# Patient Record
Sex: Male | Born: 1985 | Race: White | Hispanic: No | Marital: Married | Smoking: Current every day smoker
Health system: Southern US, Community
[De-identification: ages and names within clinical notes are randomized; demographics above are authoritative.]

## PROBLEM LIST (undated history)

## (undated) DIAGNOSIS — C801 Malignant (primary) neoplasm, unspecified: Secondary | ICD-10-CM

## (undated) HISTORY — PX: OTHER SURGICAL HISTORY: SHX169

---

## 2008-01-15 ENCOUNTER — Inpatient Hospital Stay: Payer: Self-pay | Admitting: Psychiatry

## 2010-03-16 ENCOUNTER — Emergency Department: Payer: Self-pay | Admitting: Emergency Medicine

## 2010-09-28 ENCOUNTER — Emergency Department: Payer: Self-pay | Admitting: Emergency Medicine

## 2010-11-11 ENCOUNTER — Emergency Department: Payer: Self-pay | Admitting: Unknown Physician Specialty

## 2011-03-30 ENCOUNTER — Emergency Department: Payer: Self-pay | Admitting: Internal Medicine

## 2011-07-22 ENCOUNTER — Emergency Department: Payer: Self-pay | Admitting: Emergency Medicine

## 2013-09-15 ENCOUNTER — Emergency Department: Payer: Self-pay | Admitting: Emergency Medicine

## 2013-09-15 LAB — CBC WITH DIFFERENTIAL/PLATELET
BASOS ABS: 0.1 10*3/uL (ref 0.0–0.1)
Basophil %: 0.4 %
EOS ABS: 0.1 10*3/uL (ref 0.0–0.7)
EOS PCT: 0.7 %
HCT: 44.4 % (ref 40.0–52.0)
HGB: 14.6 g/dL (ref 13.0–18.0)
Lymphocyte #: 1.5 10*3/uL (ref 1.0–3.6)
Lymphocyte %: 8.1 %
MCH: 30.5 pg (ref 26.0–34.0)
MCHC: 32.9 g/dL (ref 32.0–36.0)
MCV: 93 fL (ref 80–100)
Monocyte #: 0.7 x10 3/mm (ref 0.2–1.0)
Monocyte %: 3.8 %
NEUTROS ABS: 15.7 10*3/uL — AB (ref 1.4–6.5)
Neutrophil %: 87 %
PLATELETS: 318 10*3/uL (ref 150–440)
RBC: 4.78 10*6/uL (ref 4.40–5.90)
RDW: 12.9 % (ref 11.5–14.5)
WBC: 18.1 10*3/uL — ABNORMAL HIGH (ref 3.8–10.6)

## 2013-09-15 LAB — LIPASE, BLOOD: Lipase: 56 U/L — ABNORMAL LOW (ref 73–393)

## 2013-09-15 LAB — COMPREHENSIVE METABOLIC PANEL
Albumin: 4.7 g/dL (ref 3.4–5.0)
Alkaline Phosphatase: 66 U/L
Anion Gap: 4 — ABNORMAL LOW (ref 7–16)
BUN: 15 mg/dL (ref 7–18)
Bilirubin,Total: 0.9 mg/dL (ref 0.2–1.0)
CALCIUM: 10 mg/dL (ref 8.5–10.1)
CHLORIDE: 105 mmol/L (ref 98–107)
CO2: 29 mmol/L (ref 21–32)
CREATININE: 1.04 mg/dL (ref 0.60–1.30)
EGFR (Non-African Amer.): >60
GLUCOSE: 109 mg/dL — AB (ref 65–99)
Osmolality: 277 (ref 275–301)
POTASSIUM: 3.7 mmol/L (ref 3.5–5.1)
SGOT(AST): 27 U/L (ref 15–37)
SGPT (ALT): 26 U/L
Sodium: 138 mmol/L (ref 136–145)
Total Protein: 8.8 g/dL — ABNORMAL HIGH (ref 6.4–8.2)

## 2013-09-15 LAB — URINALYSIS, COMPLETE
Bacteria: NONE SEEN
Bilirubin,UR: NEGATIVE
Blood: NEGATIVE
Glucose,UR: NEGATIVE mg/dL (ref 0–75)
Leukocyte Esterase: NEGATIVE
Nitrite: NEGATIVE
Ph: 5 (ref 4.5–8.0)
SPECIFIC GRAVITY: 1.032 (ref 1.003–1.030)
Squamous Epithelial: NONE SEEN
WBC UR: NONE SEEN /HPF (ref 0–5)

## 2013-09-15 LAB — TROPONIN I: Troponin-I: <0.02 ng/mL

## 2014-05-18 ENCOUNTER — Emergency Department
Admit: 2014-05-18 | Disposition: A | Discharge status: Home / Self Care | Payer: Self-pay | Admitting: Emergency Medicine

## 2014-07-10 ENCOUNTER — Emergency Department
Admission: EM | Admit: 2014-07-10 | Discharge: 2014-07-10 | Disposition: A | Discharge status: Home / Self Care | Payer: Self-pay | Attending: Emergency Medicine | Admitting: Emergency Medicine

## 2014-07-10 ENCOUNTER — Encounter: Payer: Self-pay | Admitting: Emergency Medicine

## 2014-07-10 DIAGNOSIS — Z720 Tobacco use: Secondary | ICD-10-CM | POA: Insufficient documentation

## 2014-07-10 DIAGNOSIS — K029 Dental caries, unspecified: Secondary | ICD-10-CM | POA: Insufficient documentation

## 2014-07-10 DIAGNOSIS — K0381 Cracked tooth: Secondary | ICD-10-CM | POA: Insufficient documentation

## 2014-07-10 MED ORDER — LIDOCAINE VISCOUS 2 % MT SOLN: 20.0000 mL | OROMUCOSAL | Status: DC | PRN

## 2014-07-10 MED ORDER — HYDROCODONE-ACETAMINOPHEN 5-325 MG PO TABS: 1.0000 | ORAL_TABLET | ORAL | Status: DC | PRN

## 2014-07-10 MED ORDER — IBUPROFEN 800 MG PO TABS: 800.0000 mg | ORAL_TABLET | Freq: Three times a day (TID) | ORAL | Status: DC | PRN

## 2014-07-10 MED ORDER — AMOXICILLIN 500 MG PO TABS: 500.0000 mg | ORAL_TABLET | Freq: Three times a day (TID) | ORAL | Status: DC

## 2014-07-10 NOTE — ED Notes (Signed)
NAD noted at time of D/C. Pt denies questions or concerns. Pt ambulatory to the lobby at this time.  

## 2014-07-10 NOTE — ED Notes (Signed)
Patient presents to the ED with dental pain x 1 week.  Patient states his tooth cracked yesterday.  Patient is in no obvious distress at this time.
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
## 2014-07-10 NOTE — Discharge Instructions (Signed)
Dental Pain A tooth ache may be caused by cavities (tooth decay). Cavities expose the nerve of the tooth to air and hot or cold temperatures. It may come from an infection or abscess (also called a boil or furuncle) around your tooth. It is also often caused by dental caries (tooth decay). This causes the pain you are having. DIAGNOSIS  Your caregiver can diagnose this problem by exam. TREATMENT   If caused by an infection, it may be treated with medications which kill germs (antibiotics) and pain medications as prescribed by your caregiver. Take medications as directed.  Only take over-the-counter or prescription medicines for pain, discomfort, or fever as directed by your caregiver.  Whether the tooth ache today is caused by infection or dental disease, you should see your dentist as soon as possible for further care. SEEK MEDICAL CARE IF: The exam and treatment you received today has been provided on an emergency basis only. This is not a substitute for complete medical or dental care. If your problem worsens or new problems (symptoms) appear, and you are unable to meet with your dentist, call or return to this location. SEEK IMMEDIATE MEDICAL CARE IF:   You have a fever.  You develop redness and swelling of your face, jaw, or neck.  You are unable to open your mouth.  You have severe pain uncontrolled by pain medicine. MAKE SURE YOU:   Understand these instructions.  Will watch your condition.  Will get help right away if you are not doing well or get worse. Document Released: 02/04/2005 Document Revised: 04/29/2011 Document Reviewed: 09/23/2007 [facility] Patient Information 2015 [place], [place]. This information is not intended to replace advice given to you by your health care provider. Make sure you discuss any questions you have with your health care provider.  Dental Caries Dental caries (also called tooth decay) is the most common oral disease. It can occur at any age but is  more common in children and young adults.  HOW DENTAL CARIES DEVELOPS  The process of decay begins when bacteria and foods (particularly sugars and starches) combine in your mouth to produce plaque. Plaque is a substance that sticks to the hard, outer surface of a tooth (enamel). The bacteria in plaque produce acids that attack enamel. These acids may also attack the root surface of a tooth (cementum) if it is exposed. Repeated attacks dissolve these surfaces and create holes in the tooth (cavities). If left untreated, the acids destroy the other layers of the tooth.  RISK FACTORS  Frequent sipping of sugary beverages.   Frequent snacking on sugary and starchy foods, especially those that easily get stuck in the teeth.   Poor oral hygiene.   Dry mouth.   Substance abuse such as methamphetamine abuse.   Broken or poor-fitting dental restorations.   Eating disorders.   Gastroesophageal reflux disease (GERD).   Certain radiation treatments to the head and neck. SYMPTOMS In the early stages of dental caries, symptoms are seldom present. Sometimes white, chalky areas may be seen on the enamel or other tooth layers. In later stages, symptoms may include:  Pits and holes on the enamel.  Toothache after sweet, hot, or cold foods or drinks are consumed.  Pain around the tooth.  Swelling around the tooth. DIAGNOSIS  Most of the time, dental caries is detected during a regular dental checkup. A diagnosis is made after a thorough medical and dental history is taken and the surfaces of your teeth are checked for signs of  dental caries. Sometimes special instruments, such as lasers, are used to check for dental caries. Dental X-ray exams may be taken so that areas not visible to the eye (such as between the contact areas of the teeth) can be checked for cavities.  TREATMENT  If dental caries is in its early stages, it may be reversed with a fluoride treatment or an application of a  remineralizing agent at the dental office. Thorough brushing and flossing at home is needed to aid these treatments. If it is in its later stages, treatment depends on the location and extent of tooth destruction:   If a small area of the tooth has been destroyed, the destroyed area will be removed and cavities will be filled with a material such as gold, silver amalgam, or composite resin.   If a large area of the tooth has been destroyed, the destroyed area will be removed and a cap (crown) will be fitted over the remaining tooth structure.   If the center part of the tooth (pulp) is affected, a procedure called a root canal will be needed before a filling or crown can be placed.   If most of the tooth has been destroyed, the tooth may need to be pulled (extracted). HOME CARE INSTRUCTIONS You can prevent, stop, or reverse dental caries at home by practicing good oral hygiene. Good oral hygiene includes:  Thoroughly cleaning your teeth at least twice a day with a toothbrush and dental floss.   Using a fluoride toothpaste. A fluoride mouth rinse may also be used if recommended by your dentist or health care provider.   Restricting the amount of sugary and starchy foods and sugary liquids you consume.   Avoiding frequent snacking on these foods and sipping of these liquids.   Keeping regular visits with a dentist for checkups and cleanings. PREVENTION   Practice good oral hygiene.  Consider a dental sealant. A dental sealant is a coating material that is applied by your dentist to the pits and grooves of teeth. The sealant prevents food from being trapped in them. It may protect the teeth for several years.  Ask about fluoride supplements if you live in a community without fluorinated water or with water that has a low fluoride content. Use fluoride supplements as directed by your dentist or health care provider.  Allow fluoride varnish applications to teeth if directed by your  dentist or health care provider. Document Released: 10/27/2001 Document Revised: 06/21/2013 Document Reviewed: 02/07/2012 [facility] Patient Information 2015 [place], [place]. This information is not intended to replace advice given to you by your health care provider. Make sure you discuss any questions you have with your health care provider.
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
## 2014-07-31 ENCOUNTER — Emergency Department: Payer: Self-pay

## 2014-07-31 ENCOUNTER — Encounter: Payer: Self-pay | Admitting: Emergency Medicine

## 2014-07-31 ENCOUNTER — Other Ambulatory Visit: Payer: Self-pay

## 2014-07-31 DIAGNOSIS — Z792 Long term (current) use of antibiotics: Secondary | ICD-10-CM | POA: Insufficient documentation

## 2014-07-31 DIAGNOSIS — Y999 Unspecified external cause status: Secondary | ICD-10-CM | POA: Insufficient documentation

## 2014-07-31 DIAGNOSIS — T672XXA Heat cramp, initial encounter: Secondary | ICD-10-CM | POA: Insufficient documentation

## 2014-07-31 DIAGNOSIS — Y9389 Activity, other specified: Secondary | ICD-10-CM | POA: Insufficient documentation

## 2014-07-31 DIAGNOSIS — E876 Hypokalemia: Secondary | ICD-10-CM | POA: Insufficient documentation

## 2014-07-31 DIAGNOSIS — Y9289 Other specified places as the place of occurrence of the external cause: Secondary | ICD-10-CM | POA: Insufficient documentation

## 2014-07-31 DIAGNOSIS — X32XXXA Exposure to sunlight, initial encounter: Secondary | ICD-10-CM | POA: Insufficient documentation

## 2014-07-31 DIAGNOSIS — Z720 Tobacco use: Secondary | ICD-10-CM | POA: Insufficient documentation

## 2014-07-31 LAB — CBC WITH DIFFERENTIAL/PLATELET
BASOS ABS: 0.1 10*3/uL (ref 0–0.1)
Basophils Relative: 1 %
Eosinophils Absolute: 0.4 10*3/uL (ref 0–0.7)
Eosinophils Relative: 4 %
HEMATOCRIT: 44.1 % (ref 40.0–52.0)
Hemoglobin: 15.5 g/dL (ref 13.0–18.0)
Lymphocytes Relative: 25 %
Lymphs Abs: 2.8 10*3/uL (ref 1.0–3.6)
MCH: 31.1 pg (ref 26.0–34.0)
MCHC: 35.1 g/dL (ref 32.0–36.0)
MCV: 88.5 fL (ref 80.0–100.0)
MONO ABS: 1.1 10*3/uL — AB (ref 0.2–1.0)
Monocytes Relative: 10 %
NEUTROS ABS: 6.9 10*3/uL — AB (ref 1.4–6.5)
Neutrophils Relative %: 60 %
Platelets: 309 10*3/uL (ref 150–440)
RBC: 4.98 MIL/uL (ref 4.40–5.90)
RDW: 12.8 % (ref 11.5–14.5)
WBC: 11.3 10*3/uL — ABNORMAL HIGH (ref 3.8–10.6)

## 2014-07-31 LAB — COMPREHENSIVE METABOLIC PANEL
ALT: 44 U/L (ref 17–63)
ANION GAP: 11 (ref 5–15)
AST: 67 U/L — ABNORMAL HIGH (ref 15–41)
Albumin: 5.1 g/dL — ABNORMAL HIGH (ref 3.5–5.0)
Alkaline Phosphatase: 75 U/L (ref 38–126)
BUN: 21 mg/dL — AB (ref 6–20)
CHLORIDE: 101 mmol/L (ref 101–111)
CO2: 25 mmol/L (ref 22–32)
Calcium: 9.7 mg/dL (ref 8.9–10.3)
Creatinine, Ser: 1.1 mg/dL (ref 0.61–1.24)
GFR calc Af Amer: >60 mL/min (ref >60)
GLUCOSE: 86 mg/dL (ref 65–99)
Potassium: 3.3 mmol/L — ABNORMAL LOW (ref 3.5–5.1)
SODIUM: 137 mmol/L (ref 135–145)
TOTAL PROTEIN: 8.4 g/dL — AB (ref 6.5–8.1)
Total Bilirubin: 0.8 mg/dL (ref 0.3–1.2)

## 2014-07-31 MED ORDER — SODIUM CHLORIDE 0.9 % IV BOLUS (SEPSIS)
1000.0000 mL | Freq: Once | INTRAVENOUS | Status: AC
  Administered 2014-07-31: 1000 mL via INTRAVENOUS

## 2014-08-01 ENCOUNTER — Emergency Department
Admission: EM | Admit: 2014-08-01 | Discharge: 2014-08-01 | Disposition: A | Discharge status: Home / Self Care | Payer: Self-pay | Attending: Emergency Medicine | Admitting: Emergency Medicine

## 2014-08-01 DIAGNOSIS — M791 Myalgia, unspecified site: Secondary | ICD-10-CM

## 2014-08-01 DIAGNOSIS — T672XXA Heat cramp, initial encounter: Secondary | ICD-10-CM

## 2014-08-01 DIAGNOSIS — E876 Hypokalemia: Secondary | ICD-10-CM

## 2014-08-01 DIAGNOSIS — L559 Sunburn, unspecified: Secondary | ICD-10-CM

## 2014-08-01 LAB — TROPONIN I

## 2014-08-01 LAB — CK: CK TOTAL: 186 U/L (ref 49–397)

## 2014-08-01 MED ORDER — SODIUM CHLORIDE 0.9 % IV SOLN
Freq: Once | INTRAVENOUS | Status: AC
  Administered 2014-08-01: 01:00:00 via INTRAVENOUS

## 2014-08-01 MED ORDER — KETOROLAC TROMETHAMINE 30 MG/ML IJ SOLN
30.0000 mg | Freq: Once | INTRAMUSCULAR | Status: AC
  Administered 2014-08-01: 30 mg via INTRAVENOUS

## 2014-08-01 MED ORDER — POTASSIUM CHLORIDE CRYS ER 20 MEQ PO TBCR
EXTENDED_RELEASE_TABLET | ORAL | Status: AC
  Filled 2014-08-01: qty 2

## 2014-08-01 MED ORDER — KETOROLAC TROMETHAMINE 30 MG/ML IJ SOLN
INTRAMUSCULAR | Status: AC
  Filled 2014-08-01: qty 1

## 2014-08-01 MED ORDER — POTASSIUM CHLORIDE CRYS ER 20 MEQ PO TBCR
40.0000 meq | EXTENDED_RELEASE_TABLET | Freq: Once | ORAL | Status: AC
  Administered 2014-08-01: 40 meq via ORAL

## 2014-08-01 MED ORDER — SODIUM CHLORIDE 0.9 % IV BOLUS (SEPSIS)
1000.0000 mL | Freq: Once | INTRAVENOUS | Status: AC
  Administered 2014-08-01: 1000 mL via INTRAVENOUS
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
## 2014-08-01 NOTE — ED Notes (Signed)
Called lab to add CK on blood work.

## 2014-08-01 NOTE — ED Provider Notes (Signed)
[facility] Emergency Department Provider Note  ____________________________________________  Time seen: Approximately 1:15 AM  I have reviewed the triage vital signs and the nursing notes.   HISTORY  Chief Complaint Generalized Body Aches    HPI Trevor Zimmerman is a 29 y.o. male who presents with wife for myalgias. Per spouse, patient has worked 16 hour days all weekend and "made more money this week than he does in one month". 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 Patient returned home this evening feeling generally weak with myalgias, cramping in his lower extremities. Patient's spouse says he was "in and out" and seemed lethargic. Patient denies fall, trauma, injury. Patient does complain of a nonproductive cough in addition to extreme fatigue. Denies fever, chills, chest pain, shortness of breath, abdominal pain, nausea, vomiting, diarrhea, numbness, tingling.   History reviewed. No pertinent past medical history.  There are no active problems to display for this patient.   Past Surgical History  Procedure Laterality Date  . 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 amoxicillin (AMOXIL) 500 MG tablet   Oral   Take 1 tablet (500 mg total) by mouth 3 (three) times daily.   30 tablet   0   . HYDROcodone-acetaminophen (NORCO) 5-325 MG per tablet   Oral   Take 1 tablet by mouth every 4 (four) hours as needed for moderate pain.   12 tablet   0   . ibuprofen (ADVIL,MOTRIN) 800 MG tablet   Oral   Take 1 tablet (800 mg total) by mouth every 8 (eight) hours as needed.   30 tablet   0   . lidocaine (XYLOCAINE) 2 % solution   Mouth/Throat   Use as directed 20 mLs in the mouth or throat as needed for mouth pain.   100 mL   0     Allergies Review of patient's allergies indicates no known allergies.  History reviewed. No  pertinent family history.  Social History History  Substance Use Topics  . Smoking status: Current Every Day Smoker -- 0.50 packs/day    Types: Cigarettes  . Smokeless tobacco: Not on file  . Alcohol Use: No    Review of Systems Constitutional: Positive for malaise and generalized weakness. No fever/chills Eyes: No visual changes. ENT: No sore throat. Cardiovascular: Denies chest pain. Respiratory: Denies shortness of breath. Gastrointestinal: No abdominal pain.  No nausea, no vomiting.  No diarrhea.  No constipation. Genitourinary: Negative for dysuria. Musculoskeletal: Negative for back pain. Skin: Positive for sunburn. Negative for rash. Neurological: Negative for headaches, focal weakness or numbness.  10-point ROS otherwise negative.  ____________________________________________   PHYSICAL EXAM:  VITAL SIGNS: ED Triage Vitals  Enc Vitals Group     BP 07/31/14 2255 142/91 mmHg     Pulse Rate 07/31/14 2255 99     Resp 08/01/14 0041 16     Temp 07/31/14 2255 98.3 F (36.8 C)     Temp Source 07/31/14 2255 Oral     SpO2 07/31/14 2255 99 %     Weight 07/31/14 2255 125 lb (56.7 kg)     Height 07/31/14 2255 5\' 6"  (1.676 m)     Head Cir --      Peak Flow --      Pain Score 07/31/14 2256 4     Pain  Loc --      Pain Edu? --      Excl. in [place]? --     Constitutional: Sleeping. Appears fatigued when awakened for exam. Eyes: Conjunctivae are normal. PERRL. EOMI. Head: Atraumatic. Nose: No congestion/rhinnorhea. Mouth/Throat: Mucous membranes are dry.  Oropharynx non-erythematous. Neck: No stridor.   Cardiovascular: Normal rate, regular rhythm. Grossly normal heart sounds.  Good peripheral circulation. Respiratory: Normal respiratory effort.  No retractions. Lungs CTAB. Gastrointestinal: Soft and nontender. No distention. No abdominal bruits. No CVA tenderness. Musculoskeletal: No lower extremity tenderness nor edema.  No joint effusions. Neurologic:  Normal speech and  language. No gross focal neurologic deficits are appreciated. Speech is normal.  Skin:  First degree sunburn to face and extremities without blistering or peeling. Skin is warm, dry and intact. No rash noted. Psychiatric: Mood and affect are flat. Speech and behavior are normal.  ____________________________________________   LABS (all labs ordered are listed, but only abnormal results are displayed)  Labs Reviewed  COMPREHENSIVE METABOLIC PANEL - Abnormal; Notable for the following:    Potassium 3.3 (*)    BUN 21 (*)    Total Protein 8.4 (*)    Albumin 5.1 (*)    AST 67 (*)    All other components within normal limits  CBC WITH DIFFERENTIAL/PLATELET - Abnormal; Notable for the following:    WBC 11.3 (*)    Neutro Abs 6.9 (*)    Monocytes Absolute 1.1 (*)    All other components within normal limits  CK  TROPONIN I   ____________________________________________  EKG  ED ECG REPORT I, SUNG,JADE J, the attending physician, personally viewed and interpreted this ECG.   Date: 08/01/2014  EKG Time: 2336  Rate: 74  Rhythm: normal EKG, normal sinus rhythm  Axis: Normal  Intervals:none  ST&T Change: Nonspecific  ____________________________________________  RADIOLOGY  Chest 2 view (viewed by me, interpreted by Dr. Radene Knee): No acute cardiopulmonary process seen. ____________________________________________   PROCEDURES  Procedure(s) performed: None  Critical Care performed: No  ____________________________________________   INITIAL IMPRESSION / ASSESSMENT AND PLAN / ED COURSE  Pertinent labs & imaging results that were available during my care of the patient were reviewed by me and considered in my medical decision making (see chart for details).  29 year old male with prolonged sun exposure who presents with generalized weakness, malaise, leg cramping. Plan for IV fluid resuscitation, labs notable for hypokalemia; will replete while in the ED. Will check a CK and  reassess.  ----------------------------------------- 3:09 AM on 08/01/2014 -----------------------------------------  Patient resting in no acute distress. CK and troponin negative. Discussed with patient and spouse and given strict return precautions. Both verbalize understanding and agree with plan of care. ____________________________________________   FINAL CLINICAL IMPRESSION(S) / ED DIAGNOSES  Final diagnoses:  Heat cramps, initial encounter  Myalgia  Hypokalemia  Sunburn      Paulette Blanch, MD 08/01/14 [phone]

## 2014-08-01 NOTE — ED Notes (Signed)
Called lab to add troponin to blood work 

## 2014-08-01 NOTE — Discharge Instructions (Signed)
1. Stay indoors out of the heat and drink plenty of fluids over the next several days. 2. Eat foods which are rich in potassium (orange juice, bananas). 3. Return to the ER for worsening symptoms, persistent vomiting, difficulty breathing or other concerns.  Heat-Related Illness Heat-related illnesses occur when the body is unable to properly cool itself. The body normally cools itself by sweating. However, under some conditions sweating is not enough. In these cases, a person's body temperature rises rapidly. Very high body temperatures may damage the brain or other vital organs. Some examples of heat-related illnesses include:  Heat stroke. This occurs when the body is unable to regulate its temperature. The body's temperature rises rapidly, the sweating mechanism fails, and the body is unable to cool down. Body temperature may rise to 106 F (41 C) or higher within 10 to 15 minutes. Heat stroke can cause death or permanent disability if emergency treatment is not provided.  Heat exhaustion. This is a milder form of heat-related illness that can develop after several days of exposure to high temperatures and not enough fluids. It is the body's response to an excessive loss of the water and salt contained in sweat.  Heat cramps. These usually affect people who sweat a lot during heavy activity. This sweating drains the body's salt and moisture. The low salt level in the muscles causes painful cramps. Heat cramps may also be a symptom of heat exhaustion. Heat cramps usually occur in the abdomen, arms, or legs. Get medical attention for cramps if you have heart problems or are on a low-sodium diet. Those that are at greatest risk for heat-related illnesses include:   The elderly.  Infant and the very young.  People with mental illness and chronic diseases.  People who are overweight (obese).  Young and healthy people can even succumb to heat if they participate in strenuous physical activities  during hot weather. CAUSES  Several factors affect the body's ability to cool itself during extremely hot weather. When the humidity is high, sweat will not evaporate as quickly. This prevents the body from releasing heat quickly. Other factors that can affect the body's ability to cool down include:   Age.  Obesity.  Fever.  Dehydration.  Heart disease.  Mental illness.  Poor circulation.  Sunburn.  Prescription drug use.  Alcohol use. SYMPTOMS  Heat stroke: Warning signs of heat stroke vary, but may include:  An extremely high body temperature (above 103F orally).  A fast, strong pulse.  Dizziness.  Confusion.  Red, hot, and dry skin.  No sweating.  Throbbing headache.  Feeling sick to your stomach (nauseous).  Unconsciousness. Heat exhaustion: Warning signs of heat exhaustion include:  Heavy sweating.  Tiredness.  Headache.  Paleness.  Weakness.  Feeling sick to your stomach (nauseous) or vomiting.  Muscle cramps. Heat cramps  Muscle pains or spasms. TREATMENT  Heat stroke  Get into a cool environment. An indoor place that is air-conditioned may be best.  Take a cool shower or bath. Have someone around to make sure you are okay.  Take your temperature. Make sure it is going down. Heat exhaustion  Drink plenty of fluids. Do not drink liquids that contain caffeine, alcohol, or large amounts of sugar. These cause you to lose more body fluid. Also, avoid very cold drinks. They can cause stomach cramps.  Get into a cool environment. An indoor place that is air-conditioned may be best.  Take a cool shower or bath. Have someone around to make  sure you are okay.  Put on lightweight clothing. Heat cramps  Stop whatever activity you were doing. Do not attempt to do that activity for at least 3 hours after the cramps have gone away.  Get into a cool environment. An indoor place that is air-conditioned may be best. [place]  To  protect your health when temperatures are extremely high, follow these tips:  During heavy exercise in a hot environment, drink two to four glasses (16-32 ounces) of cool fluids each hour. Do not wait until you are thirsty to drink. Warning: If your caregiver limits the amount of fluid you drink or has you on water pills, ask how much you should drink while the weather is hot.  Do not drink liquids that contain caffeine, alcohol, or large amounts of sugar. These cause you to lose more body fluid.  Avoid very cold drinks. They can cause stomach cramps.  Wear appropriate clothing. Choose lightweight, light-colored, loose-fitting clothing.  If you must be outdoors, try to limit your outdoor activity to morning and evening hours. Try to rest often in shady areas.  If you are not used to working or exercising in a hot environment, start slowly and pick up the pace gradually.  Stay cool in an air-conditioned place if possible. If your home does not have air conditioning, go to the shopping mall or [employer].  Taking a cool shower or bath may help you cool off. SEEK MEDICAL CARE IF:   You see any of the symptoms listed above. You may be dealing with a life-threatening emergency.  Symptoms worsen or last longer than 1 hour.  Heat cramps do not get better in 1 hour. MAKE SURE YOU:   Understand these instructions.  Will watch your condition.  Will get help right away if you are not doing well or get worse. Document Released: 11/14/2007 Document Revised: 04/29/2011 Document Reviewed: 11/14/2007 [facility] Patient Information 2015 [place], [place]. This information is not intended to replace advice given to you by your health care provider. Make sure you discuss any questions you have with your health care provider.  Leg Cramps Leg cramps that occur during exercise can be caused by poor circulation or dehydration. However, muscle cramps that occur at rest or during the night are usually not  due to any serious medical problem. Heat cramps may cause muscle spasms during hot weather.  CAUSES There is no clear cause for muscle cramps. However, dehydration may be a factor for those who do not drink enough fluids and those who exercise in the heat. Imbalances in the level of sodium, potassium, calcium or magnesium in the muscle tissue may also be a factor. Some medications, such as water pills (diuretics), may cause loss of chemicals that the body needs (like sodium and potassium) and cause muscle cramps. TREATMENT   Make sure your diet has enough fluids and essential minerals for the muscle to work normally.  Avoid strenuous exercise for several days if you have been having frequent leg cramps.  Stretch and massage the cramped muscle for several minutes.  Some medicines may be helpful in some patients with night cramps. Only take over-the-counter or prescription medicines as directed by your caregiver. SEEK IMMEDIATE MEDICAL CARE IF:   Your leg cramps become worse.  Your foot becomes cold, numb, or blue. Document Released: 03/14/2004 Document Revised: 04/29/2011 Document Reviewed: 03/01/2008 [facility] Patient Information 2015 [place], [place]. This information is not intended to replace advice given to you by your health care  provider. Make sure you discuss any questions you have with your health care provider.  Sunburn Sunburn is damage to the skin caused by overexposure to ultraviolet (UV) rays. People with light skin or a fair complexion may be more susceptible to sunburn. Repeated sun exposure causes early skin aging such as wrinkles and sun spots. It also increases the risk of skin cancer. CAUSES A sunburn is caused by getting too much UV radiation from the sun. SYMPTOMS  Red or pink skin.  Soreness and swelling.  Pain.  Blisters.  Peeling skin.  Headache, fever, and fatigue if sunburn covers a large area. TREATMENT  Your caregiver may tell you to take certain  medicines to lessen inflammation.  Your caregiver may have you use hydrocortisone cream or spray to help with itching and inflammation.  Your caregiver may prescribe an antibiotic cream to use on blisters. HOME CARE INSTRUCTIONS   Avoid further exposure to the sun.  Cool baths and cool compresses may be helpful if used several times per day. Do not apply ice, since this may result in more damage to the skin.  Only take over-the-counter or prescription medicines for pain, discomfort, or fever as directed by your caregiver.  Use aloe or other over-the-counter sunburn creams or gels on your skin. Do not apply these creams or gels on blisters.  Drink enough fluids to keep your urine clear or pale yellow.  Do not break blisters. If blisters break, your caregiver may recommend an antibiotic cream to apply to the affected area. PREVENTION   Try to avoid the sun between 10:00 a.m. and 4:00 p.m. when it is the strongest.  Apply sunscreen at least 30 minutes before exposure to the sun.  Always wear protective hats, clothing, and sunglasses with UV protection.  Avoid medicines, herbs, and foods that increase your sensitivity to sunlight.  Avoid tanning beds. SEEK IMMEDIATE MEDICAL CARE IF:   You have a fever.  Your pain is uncontrolled with medicine.  You start to vomit or have diarrhea.  You feel faint or develop a headache with confusion.  You develop severe blistering.  You have a pus-like (purulent) discharge coming from the blisters.  Your burn becomes more painful and swollen. MAKE SURE YOU:  Understand these instructions.  Will watch your condition.  Will get help right away if you are not doing well or get worse. Document Released: 11/14/2004 Document Revised: 06/01/2012 Document Reviewed: 07/29/2010 [facility] Patient Information 2015 [place], [place]. This information is not intended to replace advice given to you by your health care provider. Make sure you discuss  any questions you have with your health care provider.

## 2015-04-06 ENCOUNTER — Encounter: Payer: Self-pay | Admitting: Medical Oncology

## 2015-04-06 ENCOUNTER — Emergency Department
Admission: EM | Admit: 2015-04-06 | Discharge: 2015-04-06 | Disposition: A | Discharge status: Home / Self Care | Payer: Medicaid Other | Attending: Emergency Medicine | Admitting: Emergency Medicine

## 2015-04-06 DIAGNOSIS — J069 Acute upper respiratory infection, unspecified: Secondary | ICD-10-CM | POA: Insufficient documentation

## 2015-04-06 DIAGNOSIS — F17210 Nicotine dependence, cigarettes, uncomplicated: Secondary | ICD-10-CM | POA: Diagnosis not present

## 2015-04-06 DIAGNOSIS — J029 Acute pharyngitis, unspecified: Secondary | ICD-10-CM

## 2015-04-06 DIAGNOSIS — R52 Pain, unspecified: Secondary | ICD-10-CM | POA: Diagnosis present

## 2015-04-06 LAB — RAPID INFLUENZA A&B ANTIGENS
Influenza A (ARMC): NOT DETECTED
Influenza B (ARMC): NOT DETECTED

## 2015-04-06 LAB — POCT RAPID STREP A: Streptococcus, Group A Screen (Direct): NEGATIVE

## 2015-04-06 MED ORDER — ALBUTEROL SULFATE HFA 108 (90 BASE) MCG/ACT IN AERS: 2.0000 | INHALATION_SPRAY | Freq: Four times a day (QID) | RESPIRATORY_TRACT | Status: DC | PRN

## 2015-04-06 MED ORDER — BENZONATATE 100 MG PO CAPS: 200.0000 mg | ORAL_CAPSULE | Freq: Three times a day (TID) | ORAL | Status: DC | PRN

## 2015-04-06 MED ORDER — IPRATROPIUM-ALBUTEROL 0.5-2.5 (3) MG/3ML IN SOLN
3.0000 mL | Freq: Once | RESPIRATORY_TRACT | Status: AC
  Administered 2015-04-06: 3 mL via RESPIRATORY_TRACT
  Filled 2015-04-06: qty 3

## 2015-04-06 NOTE — ED Provider Notes (Signed)
[facility] Emergency Department Provider Note ____________________________________________  Time seen: Approximately 11:52 AM  I have reviewed the triage vital signs and the nursing notes.   HISTORY  Chief Complaint Generalized Body Aches and Fatigue   HPI DEVINN LOSCALZO is a 30 y.o. male reports that he had sudden onset of flulike symptoms starting yesterday. Patient is unaware whether or not he has had a fever as he has not taken his temperature but he has had chills all day. There is been no vomiting or diarrhea. He has not taken any over-the-counter medication for this. He is currently a smoker and is aware that he has a very productive cough. He also complains of a "very sore throat". Currently he rates his pain is 5/10.Patient is a half pack a day smoker.   History reviewed. No pertinent past medical history.  There are no active problems to display for this patient.   Past Surgical History  Procedure Laterality Date  . 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 albuterol (PROVENTIL HFA;VENTOLIN HFA) 108 (90 Base) MCG/ACT inhaler   Inhalation   Inhale 2 puffs into the lungs every 6 (six) hours as needed for wheezing or shortness of breath.   1 Inhaler   2   . benzonatate (TESSALON PERLES) 100 MG capsule   Oral   Take 2 capsules (200 mg total) by mouth 3 (three) times daily as needed for cough.   30 capsule   0     Allergies Review of patient's allergies indicates no known allergies.  No family history on file.  Social History Social History  Substance Use Topics  . Smoking status: Current Every Day Smoker -- 0.50 packs/day    Types: Cigarettes  . Smokeless tobacco: None  . Alcohol Use: No    Review of Systems Constitutional: Possible fever/moderate chills Eyes: No visual changes. ENT: Positive sore throat. Cardiovascular: Denies chest pain. Respiratory: Denies  shortness of breath. Gastrointestinal: No abdominal pain.  No nausea, no vomiting.  No diarrhea.  No constipation. Genitourinary: Negative for dysuria. Musculoskeletal: Negative for back pain. Positive for body aches Skin: Negative for rash. Neurological: Negative for headaches, focal weakness or numbness.  10-point ROS otherwise negative.  ____________________________________________   PHYSICAL EXAM:  VITAL SIGNS: ED Triage Vitals  Enc Vitals Group     BP 04/06/15 1054 128/82 mmHg     Pulse Rate 04/06/15 1054 93     Resp 04/06/15 1054 20     Temp 04/06/15 1054 98.5 F (36.9 C)     Temp Source 04/06/15 1054 Oral     SpO2 04/06/15 1054 99 %     Weight 04/06/15 1054 125 lb (56.7 kg)     Height 04/06/15 1054 5\' 6"  (1.676 m)     Head Cir --      Peak Flow --      Pain Score 04/06/15 1055 5     Pain Loc --      Pain Edu? --      Excl. in [place]? --     Constitutional: Alert and oriented. Well appearing and in no acute distress. Eyes: Conjunctivae are normal. PERRL. EOMI. Head: Atraumatic. Nose: Mild congestion/rhinnorhea. EACs and TMs clear bilaterally. Mouth/Throat: Mucous membranes are moist.  Oropharynx erythematous no exudate was noted. Neck: No stridor.  Supple Hematological/Lymphatic/Immunilogical: No cervical lymphadenopathy. Cardiovascular: Normal rate, regular rhythm. Grossly normal heart sounds.  Good peripheral circulation. Respiratory: Normal respiratory effort.  No retractions. Lungs mild bilateral expiratory wheezes are heard throughout with very congested cough with wheezing not cleared with coughing. Gastrointestinal: Soft and nontender. No distention.  Musculoskeletal: No lower extremity tenderness nor edema.  No joint effusions. Neurologic:  Normal speech and language. No gross focal neurologic deficits are appreciated. No gait instability. Skin:  Skin is warm, dry and intact. No rash noted. Psychiatric: Mood and affect are normal. Speech and behavior are  normal.  ____________________________________________   LABS (all labs ordered are listed, but only abnormal results are displayed)  Labs Reviewed  RAPID INFLUENZA A&B ANTIGENS ([facility] ONLY)  CULTURE, GROUP A STREP [facility])  POCT RAPID STREP A    PROCEDURES  Procedure(s) performed: None  Critical Care performed: No  ____________________________________________   INITIAL IMPRESSION / ASSESSMENT AND PLAN / ED COURSE  Pertinent labs & imaging results that were available during my care of the patient were reviewed by me and considered in my medical decision making (see chart for details).  Influenza and strep tests were negative. Patient is encouraged take Tylenol or ibuprofen as needed for body aches. His also encouraged to discontinue smoking. He was given a prescription for Tessalon Perles as needed for cough and albuterol inhaler for wheezing. ____________________________________________   FINAL CLINICAL IMPRESSION(S) / ED DIAGNOSES  Final diagnoses:  Viral upper respiratory illness  Pharyngitis  Cigarette smoker      Johnn Hai, PA-C 04/06/15 1441  Earleen Newport, MD 04/06/15 1520

## 2015-04-06 NOTE — ED Notes (Signed)
Pt reports since yesterday he has been having flu like sx's.

## 2015-04-06 NOTE — Discharge Instructions (Signed)
Follow-up with [facility] clinic if any continued problems. Increase fluids. Tylenol or ibuprofen as needed for fever or body aches. Albuterol inhaler as needed for wheezing and Tessalon Perles as needed for cough.

## 2015-04-08 LAB — CULTURE, GROUP A STREP (THRC)

## 2015-06-11 ENCOUNTER — Emergency Department
Admission: EM | Admit: 2015-06-11 | Discharge: 2015-06-11 | Disposition: A | Discharge status: Home / Self Care | Payer: Medicaid Other | Attending: Emergency Medicine | Admitting: Emergency Medicine

## 2015-06-11 ENCOUNTER — Encounter: Payer: Self-pay | Admitting: Emergency Medicine

## 2015-06-11 ENCOUNTER — Emergency Department: Payer: Medicaid Other

## 2015-06-11 DIAGNOSIS — F17210 Nicotine dependence, cigarettes, uncomplicated: Secondary | ICD-10-CM | POA: Insufficient documentation

## 2015-06-11 DIAGNOSIS — J189 Pneumonia, unspecified organism: Secondary | ICD-10-CM

## 2015-06-11 DIAGNOSIS — R531 Weakness: Secondary | ICD-10-CM | POA: Diagnosis present

## 2015-06-11 LAB — URINE DRUG SCREEN, QUALITATIVE (ARMC ONLY)
AMPHETAMINES, UR SCREEN: NOT DETECTED
BENZODIAZEPINE, UR SCRN: NOT DETECTED
Barbiturates, Ur Screen: NOT DETECTED
COCAINE METABOLITE, UR ~~LOC~~: NOT DETECTED
Cannabinoid 50 Ng, Ur ~~LOC~~: POSITIVE — AB
MDMA (ECSTASY) UR SCREEN: NOT DETECTED
Methadone Scn, Ur: NOT DETECTED
OPIATE, UR SCREEN: NOT DETECTED
PHENCYCLIDINE (PCP) UR S: NOT DETECTED
Tricyclic, Ur Screen: NOT DETECTED

## 2015-06-11 LAB — CBC WITH DIFFERENTIAL/PLATELET
BASOS ABS: 0 10*3/uL (ref 0–0.1)
BASOS PCT: 0 %
EOS ABS: 0.1 10*3/uL (ref 0–0.7)
EOS PCT: 0 %
HCT: 39.6 % — ABNORMAL LOW (ref 40.0–52.0)
HEMOGLOBIN: 13.7 g/dL (ref 13.0–18.0)
LYMPHS PCT: 7 %
Lymphs Abs: 1.3 10*3/uL (ref 1.0–3.6)
MCH: 30.6 pg (ref 26.0–34.0)
MCHC: 34.6 g/dL (ref 32.0–36.0)
MCV: 88.5 fL (ref 80.0–100.0)
MONO ABS: 1.3 10*3/uL — AB (ref 0.2–1.0)
Monocytes Relative: 7 %
Neutro Abs: 15.2 10*3/uL — ABNORMAL HIGH (ref 1.4–6.5)
Neutrophils Relative %: 86 %
Platelets: 269 10*3/uL (ref 150–440)
RBC: 4.48 MIL/uL (ref 4.40–5.90)
RDW: 13.4 % (ref 11.5–14.5)
WBC: 17.9 10*3/uL — AB (ref 3.8–10.6)

## 2015-06-11 LAB — COMPREHENSIVE METABOLIC PANEL
ALT: 29 U/L (ref 17–63)
AST: 26 U/L (ref 15–41)
Albumin: 4.2 g/dL (ref 3.5–5.0)
Alkaline Phosphatase: 58 U/L (ref 38–126)
Anion gap: 9 (ref 5–15)
BILIRUBIN TOTAL: 0.9 mg/dL (ref 0.3–1.2)
BUN: 15 mg/dL (ref 6–20)
CALCIUM: 9.7 mg/dL (ref 8.9–10.3)
CHLORIDE: 103 mmol/L (ref 101–111)
CO2: 27 mmol/L (ref 22–32)
CREATININE: 0.87 mg/dL (ref 0.61–1.24)
Glucose, Bld: 106 mg/dL — ABNORMAL HIGH (ref 65–99)
Potassium: 3.6 mmol/L (ref 3.5–5.1)
Sodium: 139 mmol/L (ref 135–145)
TOTAL PROTEIN: 8 g/dL (ref 6.5–8.1)

## 2015-06-11 LAB — URINALYSIS COMPLETE WITH MICROSCOPIC (ARMC ONLY)
BILIRUBIN URINE: NEGATIVE
Bacteria, UA: NONE SEEN
Glucose, UA: NEGATIVE mg/dL
Hgb urine dipstick: NEGATIVE
KETONES UR: NEGATIVE mg/dL
Leukocytes, UA: NEGATIVE
NITRITE: NEGATIVE
PH: 6 (ref 5.0–8.0)
Protein, ur: 100 mg/dL — AB
Specific Gravity, Urine: 1.034 — ABNORMAL HIGH (ref 1.005–1.030)
Squamous Epithelial / LPF: NONE SEEN

## 2015-06-11 LAB — RAPID INFLUENZA A&B ANTIGENS
Influenza A (ARMC): NEGATIVE
Influenza B (ARMC): NEGATIVE

## 2015-06-11 LAB — LIPASE, BLOOD: LIPASE: 13 U/L (ref 11–51)

## 2015-06-11 MED ORDER — IPRATROPIUM-ALBUTEROL 0.5-2.5 (3) MG/3ML IN SOLN
3.0000 mL | Freq: Once | RESPIRATORY_TRACT | Status: AC
  Administered 2015-06-11: 3 mL via RESPIRATORY_TRACT
  Filled 2015-06-11: qty 3

## 2015-06-11 MED ORDER — DEXTROSE 5 % IV SOLN
1.0000 g | Freq: Once | INTRAVENOUS | Status: AC
  Administered 2015-06-11: 1 g via INTRAVENOUS
  Filled 2015-06-11: qty 10

## 2015-06-11 MED ORDER — DEXTROSE 5 % IV SOLN
500.0000 mg | Freq: Once | INTRAVENOUS | Status: AC
  Administered 2015-06-11: 500 mg via INTRAVENOUS
  Filled 2015-06-11: qty 500

## 2015-06-11 MED ORDER — ALBUTEROL SULFATE HFA 108 (90 BASE) MCG/ACT IN AERS: 2.0000 | INHALATION_SPRAY | Freq: Four times a day (QID) | RESPIRATORY_TRACT | Status: DC | PRN

## 2015-06-11 MED ORDER — AZITHROMYCIN 250 MG PO TABS: ORAL_TABLET | ORAL | Status: DC
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
## 2015-06-11 NOTE — ED Notes (Signed)
MD at bedside. 

## 2015-06-11 NOTE — ED Provider Notes (Signed)
[facility] Emergency Department Provider Note   ____________________________________________  Time seen:  I have reviewed the triage vital signs and the triage nursing note.  HISTORY  Chief Complaint Generalized Body Aches and Influenza   Historian Patient  HPI Trevor Zimmerman is a 30 y.o. male is here with upper respiratory symptomsfor about a week now. He's had some low-grade temperatures.  He is a smoker and has had some wheezing. Does not report quickly utilizing an albuterol inhaler. He states he felt like he had the flu with some vomiting and diarrhea since last Saturday. He feels generalized weakness and just under the weather. He is coughing, without any sputum production.  No chest pain.    History reviewed. No pertinent past medical history.  There are no active problems to display for this patient.   Past Surgical History  Procedure Laterality Date  . 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 albuterol (PROVENTIL HFA;VENTOLIN HFA) 108 (90 Base) MCG/ACT inhaler   Inhalation   Inhale 2 puffs into the lungs every 6 (six) hours as needed for wheezing or shortness of breath.   1 Inhaler   2   . azithromycin (ZITHROMAX) 250 MG tablet      1 tablet daily for 4 more days   4 each   0   . benzonatate (TESSALON PERLES) 100 MG capsule   Oral   Take 2 capsules (200 mg total) by mouth 3 (three) times daily as needed for cough.   30 capsule   0     Allergies Review of patient's allergies indicates no known allergies.  History reviewed. No pertinent family history.  Social History Social History  Substance Use Topics  . Smoking status: Current Every Day Smoker -- 0.50 packs/day    Types: Cigarettes  . Smokeless tobacco: None  . Alcohol Use: No    Review of Systems  Constitutional: Occasional chills. Eyes: Negative for visual changes. ENT: Negative for sore  throat. Cardiovascular: Negative for chest pain. Respiratory: Positive for shortness of breath. Gastrointestinal: Earlier this week he was having vomiting and diarrhea, this is mostly resolved.. Genitourinary: Negative for dysuria. Musculoskeletal: Negative for back pain. Skin: Negative for rash. Neurological: Negative for headache. 10 point Review of Systems otherwise negative ____________________________________________   PHYSICAL EXAM:  VITAL SIGNS: ED Triage Vitals  Enc Vitals Group     BP 06/11/15 0939 122/83 mmHg     Pulse Rate 06/11/15 0939 110     Resp 06/11/15 0939 18     Temp 06/11/15 0939 98.2 F (36.8 C)     Temp Source 06/11/15 0939 Oral     SpO2 06/11/15 0939 99 %     Weight 06/11/15 0939 125 lb (56.7 kg)     Height 06/11/15 0939 5\' 6"  (1.676 m)     Head Cir --      Peak Flow --      Pain Score 06/11/15 0943 8     Pain Loc --      Pain Edu? --      Excl. in [place]? --      Constitutional: Alert and oriented. Well appearing and in no distress. HEENT   Head: Normocephalic and atraumatic.      Eyes: Conjunctivae are normal. PERRL. Normal extraocular movements.      Ears:         Nose: No congestion/rhinnorhea.  Mouth/Throat: Mucous membranes are moist.   Neck: No stridor. Cardiovascular/Chest: Normal rate, regular rhythm.  No murmurs, rubs, or gallops. Respiratory: Normal respiratory effort without tachypnea nor retractions. Mild to moderate and expiratory wheezing in all fields. Gastrointestinal: Soft. No distention, no guarding, no rebound. Nontender.    Genitourinary/rectal:Deferred Musculoskeletal: Nontender with normal range of motion in all extremities. No joint effusions.  No lower extremity tenderness.  No edema. Neurologic:  Normal speech and language. No gross or focal neurologic deficits are appreciated. Skin:  Skin is warm, dry and intact. No rash noted. Psychiatric: Mood and affect are normal. Speech and behavior are normal. Patient  exhibits appropriate insight and judgment.  ____________________________________________   EKG I, Lisa Roca, MD, the attending physician have personally viewed and interpreted all ECGs.  94 bpm. Normal sinus rhythm. Narrow QRS. Normal axis. Minimal J-point elevation. ____________________________________________  LABS (pertinent positives/negatives)  Urinalysis negative Urine drug screen positive for cannabinoids Rapid flu negative Conference metabolic panel without significant abnormalities Lipase 13 White blood cell count 17.9, hemoglobin 13.7 and platelet count 269  ____________________________________________  RADIOLOGY All Xrays were viewed by me. Imaging interpreted by Radiologist.  Chest two-view:  IMPRESSION: Patchy opacity at the lateral right lung base, suspicious for pneumonia. __________________________________________  PROCEDURES  Procedure(s) performed: None  Critical Care performed: None  ____________________________________________   ED COURSE / ASSESSMENT AND PLAN  Pertinent labs & imaging results that were available during my care of the patient were reviewed by me and considered in my medical decision making (see chart for details).   Patient's here with flulike symptoms, with stable vital signs.  He is wheezing, having some bronchospasm related to an obvious upper respiratory infection.  His chest x-ray does show a right-sided pneumonia and he will be started on first dose IV Rocephin and azithromycin, followed by outpatient dose of azithromycin.  I will also discharge with an albuterol inhaler.  Overall is well-appearing and the rest of his labs are reassuring except for the white blood cell count which is elevated.   CONSULTATIONS:   None   Patient / Family / Caregiver informed of clinical course, medical decision-making process, and agree with plan.   I discussed return precautions, follow-up instructions, and discharged  instructions with patient and/or family.   ___________________________________________   FINAL CLINICAL IMPRESSION(S) / ED DIAGNOSES   Final diagnoses:  Pneumonia involving right lung, unspecified part of lung              Note: This dictation was prepared with Dragon dictation. Any transcriptional errors that result from this process are unintentional   Lisa Roca, MD 06/11/15 1450

## 2015-06-11 NOTE — ED Notes (Signed)
Pt alert and oriented X4, active, cooperative, pt in NAD. RR even and unlabored, color WNL.  Pt informed to return if any life threatening symptoms occur.   

## 2015-06-11 NOTE — Discharge Instructions (Signed)
You were evaluated for upper respiratory symptoms, and found to have a right-sided pneumonia. You are being treated with an antibiotic called azithromycin. You're given one dose of IV Rocephin in the emergency department followed by first dose of azithromycin in the emergency room. You're being prescribed azithromycin to complete for the next 4 days.  Return to the emergency room for any worsening condition including trouble breathing, chest pain, fever, dizziness or passing out, or any other symptoms concerning to you.   Community-Acquired Pneumonia, Adult Pneumonia is an infection of the lungs. One type of pneumonia can happen while a person is in a hospital. A different type can happen when a person is not in a hospital (community-acquired pneumonia). It is easy for this kind to spread from person to person. It can spread to you if you breathe near an infected person who coughs or sneezes. Some symptoms include:  A dry cough.  A wet (productive) cough.  Fever.  Sweating.  Chest pain. HOME CARE  Take over-the-counter and prescription medicines only as told by your doctor.  Only take cough medicine if you are losing sleep.  If you were prescribed an antibiotic medicine, take it as told by your doctor. Do not stop taking the antibiotic even if you start to feel better.  Sleep with your head and neck raised (elevated). You can do this by putting a few pillows under your head, or you can sleep in a recliner.  Do not use tobacco products. These include cigarettes, chewing tobacco, and e-cigarettes. If you need help quitting, ask your doctor.  Drink enough water to keep your pee (urine) clear or pale yellow. A shot (vaccine) can help prevent pneumonia. Shots are often suggested for:  People older than 28 years of age.  People older than 28 years of age:  Who are having cancer treatment.  Who have long-term (chronic) lung disease.  Who have problems with their body's defense system  (immune system). You may also prevent pneumonia if you take these actions:  Get the flu (influenza) shot every year.  Go to the dentist as often as told.  Wash your hands often. If soap and water are not available, use hand sanitizer. GET HELP IF:  You have a fever.  You lose sleep because your cough medicine does not help. GET HELP RIGHT AWAY IF:  You are short of breath and it gets worse.  You have more chest pain.  Your sickness gets worse. This is very serious if:  You are an older adult.  Your body's defense system is weak.  You cough up blood.   This information is not intended to replace advice given to you by your health care provider. Make sure you discuss any questions you have with your health care provider.   Document Released: 07/24/2007 Document Revised: 10/26/2014 Document Reviewed: 06/01/2014 Elsevier Interactive Patient Education [employer].

## 2016-01-16 ENCOUNTER — Encounter: Payer: Self-pay | Admitting: Emergency Medicine

## 2016-01-16 ENCOUNTER — Emergency Department
Admission: EM | Admit: 2016-01-16 | Discharge: 2016-01-16 | Disposition: A | Discharge status: Home / Self Care | Payer: Medicaid Other | Attending: Emergency Medicine | Admitting: Emergency Medicine

## 2016-01-16 ENCOUNTER — Emergency Department: Payer: Medicaid Other

## 2016-01-16 DIAGNOSIS — Z79899 Other long term (current) drug therapy: Secondary | ICD-10-CM | POA: Insufficient documentation

## 2016-01-16 DIAGNOSIS — R1084 Generalized abdominal pain: Secondary | ICD-10-CM | POA: Diagnosis not present

## 2016-01-16 DIAGNOSIS — F17210 Nicotine dependence, cigarettes, uncomplicated: Secondary | ICD-10-CM | POA: Insufficient documentation

## 2016-01-16 DIAGNOSIS — J181 Lobar pneumonia, unspecified organism: Secondary | ICD-10-CM | POA: Diagnosis not present

## 2016-01-16 DIAGNOSIS — R1011 Right upper quadrant pain: Secondary | ICD-10-CM | POA: Diagnosis not present

## 2016-01-16 DIAGNOSIS — R05 Cough: Secondary | ICD-10-CM | POA: Diagnosis present

## 2016-01-16 DIAGNOSIS — J189 Pneumonia, unspecified organism: Secondary | ICD-10-CM

## 2016-01-16 HISTORY — DX: Malignant (primary) neoplasm, unspecified: C80.1

## 2016-01-16 LAB — URINALYSIS COMPLETE WITH MICROSCOPIC (ARMC ONLY)
BACTERIA UA: NONE SEEN
Bilirubin Urine: NEGATIVE
GLUCOSE, UA: NEGATIVE mg/dL
Hgb urine dipstick: NEGATIVE
NITRITE: NEGATIVE
Protein, ur: NEGATIVE mg/dL
SPECIFIC GRAVITY, URINE: 1.027 (ref 1.005–1.030)
Squamous Epithelial / LPF: NONE SEEN
pH: 6 (ref 5.0–8.0)

## 2016-01-16 LAB — COMPREHENSIVE METABOLIC PANEL
ALK PHOS: 53 U/L (ref 38–126)
ALT: 17 U/L (ref 17–63)
ANION GAP: 8 (ref 5–15)
AST: 21 U/L (ref 15–41)
Albumin: 3.9 g/dL (ref 3.5–5.0)
BILIRUBIN TOTAL: 0.5 mg/dL (ref 0.3–1.2)
BUN: 13 mg/dL (ref 6–20)
CALCIUM: 9.4 mg/dL (ref 8.9–10.3)
CO2: 26 mmol/L (ref 22–32)
Chloride: 105 mmol/L (ref 101–111)
Creatinine, Ser: 0.71 mg/dL (ref 0.61–1.24)
GFR calc non Af Amer: >60 mL/min (ref >60)
Glucose, Bld: 98 mg/dL (ref 65–99)
Potassium: 3.8 mmol/L (ref 3.5–5.1)
Sodium: 139 mmol/L (ref 135–145)
TOTAL PROTEIN: 6.9 g/dL (ref 6.5–8.1)

## 2016-01-16 LAB — CBC
HEMATOCRIT: 42.3 % (ref 40.0–52.0)
HEMOGLOBIN: 14.5 g/dL (ref 13.0–18.0)
MCH: 30.9 pg (ref 26.0–34.0)
MCHC: 34.2 g/dL (ref 32.0–36.0)
MCV: 90.5 fL (ref 80.0–100.0)
Platelets: 238 10*3/uL (ref 150–440)
RBC: 4.67 MIL/uL (ref 4.40–5.90)
RDW: 13 % (ref 11.5–14.5)
WBC: 15.7 10*3/uL — ABNORMAL HIGH (ref 3.8–10.6)

## 2016-01-16 LAB — LIPASE, BLOOD: Lipase: 20 U/L (ref 11–51)

## 2016-01-16 MED ORDER — AZITHROMYCIN 250 MG PO TABS: ORAL_TABLET | ORAL | 0 refills | Status: DC

## 2016-01-16 MED ORDER — PREDNISONE 10 MG PO TABS
40.0000 mg | ORAL_TABLET | Freq: Every day | ORAL | 0 refills | Status: AC
Start: 2016-01-16 — End: 2016-01-21

## 2016-01-16 MED ORDER — ACETAMINOPHEN 325 MG PO TABS
650.0000 mg | ORAL_TABLET | Freq: Once | ORAL | Status: AC
  Administered 2016-01-16: 650 mg via ORAL
  Filled 2016-01-16: qty 2

## 2016-01-16 MED ORDER — IOPAMIDOL (ISOVUE-300) INJECTION 61%
100.0000 mL | Freq: Once | INTRAVENOUS | Status: AC | PRN
  Administered 2016-01-16: 100 mL via INTRAVENOUS
  Filled 2016-01-16: qty 100

## 2016-01-16 MED ORDER — TRAMADOL HCL 50 MG PO TABS
50.0000 mg | ORAL_TABLET | Freq: Once | ORAL | Status: AC
Start: 2016-01-16 — End: 2016-01-16
  Administered 2016-01-16: 50 mg via ORAL
  Filled 2016-01-16: qty 1

## 2016-01-16 MED ORDER — IOPAMIDOL (ISOVUE-300) INJECTION 61%
30.0000 mL | Freq: Once | INTRAVENOUS | Status: AC
  Administered 2016-01-16: 30 mL via ORAL
  Filled 2016-01-16: qty 30

## 2016-01-16 NOTE — ED Provider Notes (Signed)
[facility] Emergency Department Provider Note   ____________________________________________   First MD Initiated Contact with Patient 01/16/16 1546     (approximate)  I have reviewed the triage vital signs and the nursing notes.   HISTORY  Chief Complaint Cough   HPI Trevor Zimmerman is a 30 y.o. male that presents with 2 weeks of increased cough and body aches. Patient states that he has been coughing for 2 months but it has recently gotten worse. Patient states he is coughing up green sputum. Patient also states that he has also had fevers on and off since April, lost 40 lbs in the last 2 months, has mid back pain, vomits occasionally, has numbness in left pinky toe, feels weak, has diarrhea, and gets a chest and back rash. Patient came to the ED because his wife was concerned about weight loss. Patient also states that he had breast cancer in 2008 and had a tumor removed. Patient states he was supposed to get 36 rounds of chemotherapy and only completed 2 rounds. Patient has not taken anything for symptoms. Mother has scleroderma.    Past Medical History:  Diagnosis Date  . Cancer [facility])     Patient Active Problem List   Diagnosis Date Noted  . Community acquired pneumonia of left lower lobe of lung ([place])   . Generalized abdominal pain     Past Surgical History:  Procedure Laterality Date  . 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 Authorizing Provider  albuterol (PROVENTIL HFA;VENTOLIN HFA) 108 (90 Base) MCG/ACT inhaler Inhale 2 puffs into the lungs every 6 (six) hours as needed for wheezing or shortness of breath. 06/11/15   Lisa Roca, MD  azithromycin (ZITHROMAX Z-PAK) 250 MG tablet Take 2 tablets on day 1, then 1 tablet on days 2, 3, 4, 5 01/16/16   Laban Emperor, PA-C  benzonatate (TESSALON PERLES) 100 MG capsule Take 2 capsules (200 mg total) by mouth 3 (three)  times daily as needed for cough. 04/06/15 04/05/16  Johnn Hai, PA-C  predniSONE (DELTASONE) 10 MG tablet Take 4 tablets (40 mg total) by mouth daily. 01/16/16 01/21/16  Laban Emperor, PA-C    Allergies Patient has no known allergies.  History reviewed. No pertinent family history.  Social History Social History  Substance Use Topics  . Smoking status: Current Every Day Smoker    Packs/day: 0.50    Types: Cigarettes  . Smokeless tobacco: Never Used  . Alcohol use No    Review of Systems Constitutional: Positive for fever/chills. Eyes: No visual changes. Cardiovascular: Denies chest pain. Respiratory: Occasional SOB. Productive cough with green sputum. Gastrointestinal: No nausea, occasional vomiting.  Occasional diarrhea.  No constipation. Genitourinary: Negative for dysuria. Musculoskeletal: Positive for muscle aches. Mid back pain.  Skin: Chest and back rash Neurological: Positive for headaches.   ____________________________________________   PHYSICAL EXAM:  VITAL SIGNS: ED Triage Vitals  Enc Vitals Group     BP 01/16/16 1532 120/74     Pulse Rate 01/16/16 1532 96     Resp 01/16/16 1532 18     Temp 01/16/16 1532 98.5 F (36.9 C)     Temp Source 01/16/16 1532 Oral     SpO2 01/16/16 1532 98 %     Weight 01/16/16 1533 121 lb (54.9 kg)     Height --      Head Circumference --  Peak Flow --      Pain Score 01/16/16 1533 7     Pain Loc --      Pain Edu? --      Excl. in [place]? --    Constitutional: Alert and oriented. Well appearing and in no acute distress. Eyes: Conjunctivae are normal. PERRL. EOMI. Ears: Tympanic membranes pearly grey with good landmarks Head: Atraumatic. Nose: No congestion/rhinnorhea. Mouth/Throat: Mucous membranes are moist.  Oropharynx non-erythematous. Neck:  No cervical spine tenderness to palpation. No cervical lymphadenopathy.  Cardiovascular: Normal rate, regular rhythm.  Good peripheral circulation. Respiratory: Normal  respiratory effort.  No retractions. Lungs CTAB. Gastrointestinal: Tender to palpation in right upper quadrant. No distention. No abdominal bruits. No CVA tenderness. Musculoskeletal: No lower extremity tenderness nor edema.  No joint effusions. Neurologic:  Normal speech and language. No gross focal neurologic deficits are appreciated. No gait instability. Skin:  Skin is warm, dry and intact. No rash noted. Psychiatric: Mood and affect are normal. Speech and behavior are normal.  ____________________________________________   LABS (all labs ordered are listed, but only abnormal results are displayed)  Labs Reviewed  CBC - Abnormal; Notable for the following:       Result Value   WBC 15.7 (*)    All other components within normal limits  URINALYSIS COMPLETEWITH MICROSCOPIC ([facility] ONLY) - Abnormal; Notable for the following:    Color, Urine YELLOW (*)    APPearance CLEAR (*)    Ketones, ur TRACE (*)    Leukocytes, UA TRACE (*)    All other components within normal limits  COMPREHENSIVE METABOLIC PANEL  LIPASE, BLOOD   ____________________________________________  EKG  ____________________________________________  RADIOLOGY  Robinette Haines, personally viewed and evaluated these images (plain radiographs) as part of my medical decision making, as well as reviewing the written report by the radiologist.  Xray findings per radiology   IMPRESSION:  Normal chest radiograph.     CT findings per radiology IMPRESSION:  There is an inflammatory process involving the ascending colon and  possibly the tip of the appendix. Acute appendicitis cannot be  excluded. Alternatively, the appendix may be secondarily inflamed by  colitis. Correlate clinically as for further management. Critical  Value/emergent results were called by telephone at the time of  interpretation on 01/16/2016 at 7:26 pm to Dr. Rollene Fare, who  verbally acknowledged these results.     PROCEDURES  Procedure(s) performed: No  Critical Care performed: No ____________________________________________   INITIAL IMPRESSION / ASSESSMENT AND PLAN / ED COURSE  Pertinent labs & imaging results that were available during my care of the patient were reviewed by me and considered in my medical decision making (see chart for details).   Clinical Course     Patient presented for cough and body aches. While giving a history patient noted several vague complaints. Patient states that his mother has scleroderma, which she has not been worked up for. Patient also stated that he had a 40 pound weight loss in 2 months. Patient also stated that he had a history of breast cancer, had a tumor removed, and did not complete chemotherapy. Computer weights do not show 40 pound weight loss but it is unknown whether these weights were patient reported or whether weights were taken in hospital.   Chest xray was ordered to look for signs of pneumonia. Because of weight loss and vague symptoms for months, blood work was completed. No signs of cardiopulmonary disease were seen on xray. Labs showed an elevated WBC  and on exam and patient exhibited right upper quadrant tenderness so CT abdomen was ordered. CT indicated signs of inflammation at appendix. CT also showed consolidation in lower left lobe. On reassessment of patient, patient continued to display right upper quadrant pain with palpation and mild lower right quadrant pain with deep palpation. Suspicion of appendicitis was low so Dr. Mariea Clonts was consulted. Dr. Lorette Ang opinion was to consult surgery. Surgery was consult and came to assess the patient. They did not feel that patient needed to be put on observation for appendicitis. Patient was sent home with azithromycin and prednisone for pneumonia. Patient was instructed to follow-up with family doctor to have workup for symptoms and possible scleroderma. Patient was instructed to return to the ED  for any symptoms that change, persist, or worsen.   ____________________________________________   FINAL CLINICAL IMPRESSION(S) / ED DIAGNOSES  Final diagnoses:  Community acquired pneumonia of left lower lobe of lung ([place])      NEW MEDICATIONS STARTED DURING THIS VISIT:  Discharge Medication List as of 01/16/2016  9:14 PM    START taking these medications   Details  predniSONE (DELTASONE) 10 MG tablet Take 4 tablets (40 mg total) by mouth daily., Starting Tue 01/16/2016, Until Sun 01/21/2016, Print         Note:  This document was prepared using Dragon voice recognition software and may include unintentional dictation errors.   Laban Emperor, PA-C 01/17/16 0005    Eula Listen, MD 01/22/16 [phone]

## 2016-01-16 NOTE — ED Notes (Signed)
Pt ambulatory to toilet to attempt urine sample.  

## 2016-01-16 NOTE — ED Triage Notes (Signed)
Pt c/o cough with congestion and bodyaches for the past 1-2 months, this is the first time being checked out for this illness.Marland Kitchen

## 2016-01-16 NOTE — Consult Note (Signed)
Patient ID: Trevor Zimmerman, male   DOB: Oct 14, 1985, 30 y.o.   MRN: [id number]  CC: Cough and Aches  HPI Trevor Zimmerman is a 30 y.o. male presents emergency department today for evaluation of a chronic cough, aches, weakness. Per the patient and his wife patient has had a cough that has caused abdominal pain over the last 6 days. He had a history of pneumonia several months ago and per him and his wife he's just never fully recovered. He's had subjective fevers and chills at home and his wife says at home she is recorded temperature of 102. He's had some nausea and vomiting but he has been able to keep food down as of late. He's also had some diarrhea but he can't, when his last bowel movement was. He states his abdomen has been sore mostly in the upper abdomen. This does not appear to be getting worse and all his symptoms started before his abdomen started being sore. Surgery was consult by PA Earleen Newport from the ER due to a CT read of possible appendicitis. Per the patient and his wife they claim a 40 pound weight loss over the last several months however patient's recorded weights from his ER visits over the last 2 years are all within 10 pounds of each other. Patient's wife also tells a story about his right fifth toe changing colors and losing sensation. However patient states that it is currently warm and with sensation.  HPI  Past Medical History:  Diagnosis Date  . Cancer [facility])   Patient reports a tumors removed from his right breast greater than 10 years ago. He doesn't actually know whether or not it was cancer and doesn't know what the tumor was. He does arm a follow-up.  Past Surgical History:  Procedure Laterality Date  . 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 No pertinent family history. Per the patient he denies any family history of heart disease, diabetes, cancers.  Social History Social History  Substance Use Topics  . Smoking status: Current Every Day  Smoker    Packs/day: 0.50    Types: Cigarettes  . Smokeless tobacco: Never Used  . Alcohol use No    No Known Allergies  No current facility-administered medications for this encounter.    Current Outpatient Prescriptions  Medication Sig Dispense Refill  . albuterol (PROVENTIL HFA;VENTOLIN HFA) 108 (90 Base) MCG/ACT inhaler Inhale 2 puffs into the lungs every 6 (six) hours as needed for wheezing or shortness of breath. 1 Inhaler 0  . azithromycin (ZITHROMAX) 250 MG tablet 1 tablet daily for 4 more days 4 each 0  . benzonatate (TESSALON PERLES) 100 MG capsule Take 2 capsules (200 mg total) by mouth 3 (three) times daily as needed for cough. 30 capsule 0     Review of Systems A Multi-point review of systems was asked and was negative except for the findings documented in history of present illness  Physical Exam Blood pressure (!) 141/87, pulse 80, temperature 98.5 F (36.9 C), temperature source Oral, resp. rate 18, weight 54.9 kg (121 lb), SpO2 100 %. CONSTITUTIONAL: Resting in bed in no obvious distress. EYES: Pupils are equal, round, and reactive to light, Sclera are non-icteric. EARS, NOSE, MOUTH AND THROAT: The oropharynx is clear. The oral mucosa is pink and moist. Hearing is intact to voice. Patient is wearing a mask due to his cough LYMPH NODES:  Lymph nodes in the neck are normal. RESPIRATORY:  Lungs are coarse  bilaterally. Without pathologic use of accessory muscles. CARDIOVASCULAR: Heart is regular without murmurs, gallops, or rubs. GI: The abdomen is soft, Mildly tender to deep palpation in the right and left upper quadrant, and nondistended. There is no lower abdominal pain, there is no rebound, there is no guarding of any quadrant. There are no palpable masses. There is no hepatosplenomegaly. There are normal bowel sounds in all quadrants. GU: Rectal deferred.   MUSCULOSKELETAL: Normal muscle strength and tone. No cyanosis or edema.   SKIN: Turgor is good and there are  no pathologic skin lesions or ulcers. NEUROLOGIC: Motor and sensation is grossly normal. Cranial nerves are grossly intact. PSYCH:  Oriented to person, place and time. Affect is normal.  Data Reviewed Images and labs reviewed. Labs showed leukocytosis of 15.7 but are otherwise all within normal limits. CT scan of the abdomen and pelvis is reviewed which does show a lower lobe pneumonia. The appendix is clearly visualized with contrast within the appendix. It measured at 7 mm which is within normal limits. There is a patchy area of potential inflammation at the distal tip also along the colon. It is not obviously related to the appendix. The base of the appendix is normal. There is no free air or free fluid visualized on the CT scan. I have personally reviewed the patient's imaging, laboratory findings and medical records.    Assessment    Pneumonia    Plan    30 year old male in the emergency department with what appears to be asymptomatic pneumonia. Radiology read of possible appendicitis does not fit with the patient's history or physical exam. Discussed with the patient and his wife the signs and symptoms of appendicitis as well as return precautions for the ER. Should his abdominal pain worsen and he developed nausea and vomiting where he can no longer keep down fluids or medications he should return to the emergency department for further evaluation. Discussed possible observation in the hospital and patient is currently not interested. Patient should have a short interval follow-up with his primary care provider to ensure improvement in his symptoms. All questions answered to the patient and patient's spouse is satisfaction. No indication for surgical intervention at this time.     Time spent with the patient was 60 minutes, with more than 50% of the time spent in face-to-face education, counseling and care coordination.     Clayburn Pert, MD FACS General Surgeon 01/16/2016, 8:33  PM

## 2016-01-16 NOTE — ED Notes (Signed)
Pt taken to CT.

## 2016-01-16 NOTE — ED Notes (Signed)
Called CT stating pt was done with contrast.

## 2016-01-16 NOTE — ED Notes (Signed)
Pt states he had pneumonia in April but since then has been congested - reports chronic cough for the last 2 months (productive cough with green and brown mucus) and has lost 40lbs in the last 2 months - states he cannot feel right last toe and it is discolored and has lost feeling - pt is c/o severe muscle and joint pain with fatigue

## 2016-01-16 NOTE — ED Notes (Signed)
Pt returned from CT via stretcher.

## 2016-01-27 ENCOUNTER — Encounter: Payer: Self-pay | Admitting: Emergency Medicine

## 2016-01-27 ENCOUNTER — Emergency Department: Payer: Medicaid Other

## 2016-01-27 ENCOUNTER — Inpatient Hospital Stay
Admission: EM | Admit: 2016-01-27 | Discharge: 2016-01-29 | DRG: 194 | Disposition: A | Discharge status: Home / Self Care | Payer: Medicaid Other | Attending: Internal Medicine | Admitting: Internal Medicine

## 2016-01-27 DIAGNOSIS — J9801 Acute bronchospasm: Secondary | ICD-10-CM | POA: Diagnosis present

## 2016-01-27 DIAGNOSIS — R0781 Pleurodynia: Secondary | ICD-10-CM

## 2016-01-27 DIAGNOSIS — Z7952 Long term (current) use of systemic steroids: Secondary | ICD-10-CM

## 2016-01-27 DIAGNOSIS — T380X5A Adverse effect of glucocorticoids and synthetic analogues, initial encounter: Secondary | ICD-10-CM | POA: Diagnosis present

## 2016-01-27 DIAGNOSIS — E8770 Fluid overload, unspecified: Secondary | ICD-10-CM

## 2016-01-27 DIAGNOSIS — D72829 Elevated white blood cell count, unspecified: Secondary | ICD-10-CM

## 2016-01-27 DIAGNOSIS — Z79899 Other long term (current) drug therapy: Secondary | ICD-10-CM

## 2016-01-27 DIAGNOSIS — R05 Cough: Secondary | ICD-10-CM

## 2016-01-27 DIAGNOSIS — Z22322 Carrier or suspected carrier of Methicillin resistant Staphylococcus aureus: Secondary | ICD-10-CM | POA: Diagnosis not present

## 2016-01-27 DIAGNOSIS — J189 Pneumonia, unspecified organism: Secondary | ICD-10-CM | POA: Diagnosis present

## 2016-01-27 DIAGNOSIS — E871 Hypo-osmolality and hyponatremia: Secondary | ICD-10-CM | POA: Diagnosis present

## 2016-01-27 DIAGNOSIS — J440 Chronic obstructive pulmonary disease with acute lower respiratory infection: Secondary | ICD-10-CM | POA: Diagnosis present

## 2016-01-27 DIAGNOSIS — J441 Chronic obstructive pulmonary disease with (acute) exacerbation: Secondary | ICD-10-CM

## 2016-01-27 DIAGNOSIS — R079 Chest pain, unspecified: Secondary | ICD-10-CM

## 2016-01-27 DIAGNOSIS — F17210 Nicotine dependence, cigarettes, uncomplicated: Secondary | ICD-10-CM | POA: Diagnosis present

## 2016-01-27 DIAGNOSIS — R0602 Shortness of breath: Secondary | ICD-10-CM

## 2016-01-27 DIAGNOSIS — Z716 Tobacco abuse counseling: Secondary | ICD-10-CM

## 2016-01-27 DIAGNOSIS — R053 Chronic cough: Secondary | ICD-10-CM

## 2016-01-27 DIAGNOSIS — R0600 Dyspnea, unspecified: Secondary | ICD-10-CM

## 2016-01-27 LAB — CBC WITH DIFFERENTIAL/PLATELET
BASOS ABS: 0.1 10*3/uL (ref 0–0.1)
Basophils Relative: 1 %
EOS PCT: 1 %
Eosinophils Absolute: 0.1 10*3/uL (ref 0–0.7)
HEMATOCRIT: 40.7 % (ref 40.0–52.0)
Hemoglobin: 13.8 g/dL (ref 13.0–18.0)
LYMPHS ABS: 1.9 10*3/uL (ref 1.0–3.6)
LYMPHS PCT: 11 %
MCH: 30.3 pg (ref 26.0–34.0)
MCHC: 33.8 g/dL (ref 32.0–36.0)
MCV: 89.7 fL (ref 80.0–100.0)
MONO ABS: 1.1 10*3/uL — AB (ref 0.2–1.0)
Monocytes Relative: 6 %
NEUTROS ABS: 14 10*3/uL — AB (ref 1.4–6.5)
Neutrophils Relative %: 81 %
Platelets: 348 10*3/uL (ref 150–440)
RBC: 4.54 MIL/uL (ref 4.40–5.90)
RDW: 13.2 % (ref 11.5–14.5)
WBC: 17.2 10*3/uL — ABNORMAL HIGH (ref 3.8–10.6)

## 2016-01-27 LAB — BASIC METABOLIC PANEL
ANION GAP: 8 (ref 5–15)
BUN: 17 mg/dL (ref 6–20)
CALCIUM: 9.3 mg/dL (ref 8.9–10.3)
CO2: 25 mmol/L (ref 22–32)
Chloride: 101 mmol/L (ref 101–111)
Creatinine, Ser: 0.7 mg/dL (ref 0.61–1.24)
Glucose, Bld: 108 mg/dL — ABNORMAL HIGH (ref 65–99)
POTASSIUM: 3.6 mmol/L (ref 3.5–5.1)
SODIUM: 134 mmol/L — AB (ref 135–145)

## 2016-01-27 LAB — LACTIC ACID, PLASMA: Lactic Acid, Venous: 1.1 mmol/L (ref 0.5–1.9)

## 2016-01-27 LAB — PROCALCITONIN: Procalcitonin: <0.1 ng/mL

## 2016-01-27 MED ORDER — VANCOMYCIN HCL IN DEXTROSE 1-5 GM/200ML-% IV SOLN
1000.0000 mg | Freq: Once | INTRAVENOUS | Status: AC
  Administered 2016-01-27: 1000 mg via INTRAVENOUS
  Filled 2016-01-27: qty 200

## 2016-01-27 MED ORDER — IOPAMIDOL (ISOVUE-370) INJECTION 76%
75.0000 mL | Freq: Once | INTRAVENOUS | Status: AC | PRN
  Administered 2016-01-27: 75 mL via INTRAVENOUS

## 2016-01-27 MED ORDER — DOCUSATE SODIUM 100 MG PO CAPS
100.0000 mg | ORAL_CAPSULE | Freq: Two times a day (BID) | ORAL | Status: DC
  Administered 2016-01-27 – 2016-01-29 (×4): 100 mg via ORAL
  Filled 2016-01-27 (×5): qty 1

## 2016-01-27 MED ORDER — ACETAMINOPHEN 650 MG RE SUPP: 650.0000 mg | Freq: Four times a day (QID) | RECTAL | Status: DC | PRN

## 2016-01-27 MED ORDER — GUAIFENESIN ER 600 MG PO TB12
1200.0000 mg | ORAL_TABLET | Freq: Two times a day (BID) | ORAL | Status: DC
  Administered 2016-01-27 – 2016-01-29 (×4): 1200 mg via ORAL
  Filled 2016-01-27 (×4): qty 2

## 2016-01-27 MED ORDER — SODIUM CHLORIDE 0.9 % IV BOLUS (SEPSIS)
1000.0000 mL | Freq: Once | INTRAVENOUS | Status: AC
  Administered 2016-01-27: 1000 mL via INTRAVENOUS

## 2016-01-27 MED ORDER — KETOROLAC TROMETHAMINE 30 MG/ML IJ SOLN
30.0000 mg | Freq: Three times a day (TID) | INTRAMUSCULAR | Status: DC | PRN
  Administered 2016-01-27: 30 mg via INTRAVENOUS
  Filled 2016-01-27: qty 1

## 2016-01-27 MED ORDER — PIPERACILLIN-TAZOBACTAM 3.375 G IVPB 30 MIN
3.3750 g | Freq: Once | INTRAVENOUS | Status: AC
  Administered 2016-01-27: 3.375 g via INTRAVENOUS
  Filled 2016-01-27: qty 50

## 2016-01-27 MED ORDER — IPRATROPIUM-ALBUTEROL 0.5-2.5 (3) MG/3ML IN SOLN
3.0000 mL | Freq: Four times a day (QID) | RESPIRATORY_TRACT | Status: DC
  Administered 2016-01-27 – 2016-01-29 (×7): 3 mL via RESPIRATORY_TRACT
  Filled 2016-01-27 (×8): qty 3

## 2016-01-27 MED ORDER — ONDANSETRON HCL 4 MG/2ML IJ SOLN: 4.0000 mg | Freq: Four times a day (QID) | INTRAMUSCULAR | Status: DC | PRN

## 2016-01-27 MED ORDER — HYDROCOD POLST-CPM POLST ER 10-8 MG/5ML PO SUER
5.0000 mL | Freq: Two times a day (BID) | ORAL | Status: DC | PRN
  Administered 2016-01-27 – 2016-01-29 (×3): 5 mL via ORAL
  Filled 2016-01-27 (×3): qty 5

## 2016-01-27 MED ORDER — VANCOMYCIN HCL IN DEXTROSE 750-5 MG/150ML-% IV SOLN
750.0000 mg | Freq: Three times a day (TID) | INTRAVENOUS | Status: DC
  Administered 2016-01-27 – 2016-01-28 (×3): 750 mg via INTRAVENOUS
  Filled 2016-01-27 (×5): qty 150

## 2016-01-27 MED ORDER — METHYLPREDNISOLONE SODIUM SUCC 125 MG IJ SOLR
60.0000 mg | Freq: Two times a day (BID) | INTRAMUSCULAR | Status: DC
  Administered 2016-01-27 – 2016-01-28 (×3): 60 mg via INTRAVENOUS
  Filled 2016-01-27 (×3): qty 2

## 2016-01-27 MED ORDER — PREDNISONE 10 MG PO TABS: ORAL_TABLET | ORAL | 0 refills | Status: DC

## 2016-01-27 MED ORDER — ONDANSETRON HCL 4 MG/2ML IJ SOLN
4.0000 mg | Freq: Once | INTRAMUSCULAR | Status: AC
  Administered 2016-01-27: 4 mg via INTRAVENOUS
  Filled 2016-01-27: qty 2

## 2016-01-27 MED ORDER — PANTOPRAZOLE SODIUM 40 MG PO TBEC
40.0000 mg | DELAYED_RELEASE_TABLET | Freq: Every day | ORAL | Status: DC
  Administered 2016-01-27 – 2016-01-29 (×3): 40 mg via ORAL
  Filled 2016-01-27 (×3): qty 1

## 2016-01-27 MED ORDER — ACETAMINOPHEN 325 MG PO TABS: 650.0000 mg | ORAL_TABLET | Freq: Four times a day (QID) | ORAL | Status: DC | PRN

## 2016-01-27 MED ORDER — HEPARIN SODIUM (PORCINE) 5000 UNIT/ML IJ SOLN
5000.0000 [IU] | Freq: Three times a day (TID) | INTRAMUSCULAR | Status: DC
Start: 2016-01-27 — End: 2016-01-29
  Administered 2016-01-27 – 2016-01-29 (×6): 5000 [IU] via SUBCUTANEOUS
  Filled 2016-01-27 (×6): qty 1

## 2016-01-27 MED ORDER — POTASSIUM CHLORIDE IN NACL 20-0.9 MEQ/L-% IV SOLN
INTRAVENOUS | Status: DC
  Administered 2016-01-27: 21:00:00 via INTRAVENOUS
  Filled 2016-01-27 (×4): qty 1000

## 2016-01-27 MED ORDER — IPRATROPIUM-ALBUTEROL 0.5-2.5 (3) MG/3ML IN SOLN
3.0000 mL | Freq: Once | RESPIRATORY_TRACT | Status: AC
Start: 2016-01-27 — End: 2016-01-27
  Administered 2016-01-27: 3 mL via RESPIRATORY_TRACT
  Filled 2016-01-27: qty 3

## 2016-01-27 MED ORDER — PIPERACILLIN-TAZOBACTAM 3.375 G IVPB
3.3750 g | Freq: Three times a day (TID) | INTRAVENOUS | Status: DC
  Administered 2016-01-27 – 2016-01-28 (×3): 3.375 g via INTRAVENOUS
  Filled 2016-01-27 (×3): qty 50

## 2016-01-27 MED ORDER — ONDANSETRON HCL 4 MG PO TABS: 4.0000 mg | ORAL_TABLET | Freq: Four times a day (QID) | ORAL | Status: DC | PRN

## 2016-01-27 MED ORDER — BISACODYL 10 MG RE SUPP: 10.0000 mg | Freq: Every day | RECTAL | Status: DC | PRN

## 2016-01-27 MED ORDER — PIPERACILLIN-TAZOBACTAM 3.375 G IVPB: 3.3750 g | Freq: Three times a day (TID) | INTRAVENOUS | Status: DC

## 2016-01-27 MED ORDER — ALBUTEROL SULFATE HFA 108 (90 BASE) MCG/ACT IN AERS: 2.0000 | INHALATION_SPRAY | Freq: Four times a day (QID) | RESPIRATORY_TRACT | 0 refills | Status: DC | PRN

## 2016-01-27 MED ORDER — MORPHINE SULFATE (PF) 4 MG/ML IV SOLN
4.0000 mg | Freq: Once | INTRAVENOUS | Status: AC
  Administered 2016-01-27: 4 mg via INTRAVENOUS
  Filled 2016-01-27: qty 1

## 2016-01-27 NOTE — Progress Notes (Signed)
Continuious pulse ox d/c as it was placed by ED MD

## 2016-01-27 NOTE — ED Notes (Signed)
Pt transported to room 203. 

## 2016-01-27 NOTE — Consult Note (Signed)
Pharmacy Antibiotic Note  Trevor Zimmerman is a 30 y.o. male admitted on 01/27/2016 with pneumonia.  Pharmacy has been consulted for vancomycin and zosyn dosing.  Plan: Vancomycin 1 g given in the ER. Will give next dose in 6 hours for stacked dosing Vancomycin 750 IV every 8 hours.  Goal trough 15-20 mcg/mL. Zosyn 3.375g IV q8h (4 hour infusion).  vanc trough at steady state 1210 @2230   Height: 5\' 6"  (167.6 cm) Weight: 121 lb (54.9 kg) IBW/kg (Calculated) : 63.8  Temp (24hrs), Avg:99.5 F (37.5 C), Min:99.4 F (37.4 C), Max:99.6 F (37.6 C)   Recent Labs Lab 01/27/16 1430 01/27/16 1645  WBC 17.2*  --   CREATININE 0.70  --   LATICACIDVEN  --  1.1    Estimated Creatinine Clearance: 106.8 mL/min (by C-G formula based on SCr of 0.7 mg/dL).    No Known Allergies  Antimicrobials this admission: vancomycin 12/9 >>  zosyn 12/9 >>   Dose adjustments this admission:   Microbiology results: 12/9 BCx:    Thank you for allowing pharmacy to be a part of this patient's care.  Ramond Dial, Pharm.D, BCPS Clinical Pharmacist  01/27/2016 6:38 PM

## 2016-01-27 NOTE — ED Triage Notes (Signed)
Patient to ED via POV. Patient was diagnosed with pneumonia 2 weeks ago. Patient finished antibiotics. Patient states that his cough got better for a few days but then symptoms started again. Patient c/o pain with cough, shortness of breath. Cough is productive with white sputum. Patient states that he "feels like his ribs are stabbing him in the chest."

## 2016-01-27 NOTE — H&P (Signed)
History and Physical    [name] [id number] DOB: 1985/06/24 DOA: 01/27/2016  Referring physician: Dr. Reita Cliche PCP: No PCP Per Patient  Specialists: none  Chief Complaint: bilateral CP and SOB with cough  HPI: Trevor Zimmerman is a 30 y.o. male has a past medical history significant for "cancerous" tumor removed form chest 9 years ago. Presents to ER with bilateral pleuritic CP and SOB with cough. Was given Zpak and Tessalon last week for "pneumonia" with no improvement of sx's. Presents to ER today where WBC=17k and CT shows bilateral pneumonia. He is now admitted. He still c/o CP, SOB/DOE and cough. Sputum is "brown"  Review of Systems: The patient denies anorexia, weight loss,, vision loss, decreased hearing, hoarseness, syncope,  peripheral edema, balance deficits, hemoptysis, abdominal pain, melena, hematochezia, severe indigestion/heartburn, hematuria, incontinence, genital sores, muscle weakness, suspicious skin lesions, transient blindness, difficulty walking, depression, unusual weight change, abnormal bleeding, enlarged lymph nodes, angioedema, and breast masses.   Past Medical History:  Diagnosis Date  . Cancer [facility])    Past Surgical History:  Procedure Laterality Date  . 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)     Social History:  reports that he has been smoking Cigarettes.  He has been smoking about 0.50 packs per day. He has never used smokeless tobacco. He reports that he does not drink alcohol or use drugs.  No Known Allergies  History reviewed. No pertinent family history.  Prior to Admission medications   Medication Sig Start Date End Date Taking? Authorizing Provider  albuterol (PROVENTIL HFA;VENTOLIN HFA) 108 (90 Base) MCG/ACT inhaler Inhale 2 puffs into the lungs every 6 (six) hours as needed for wheezing or shortness of breath. 01/27/16   Lisa Roca, MD  azithromycin (ZITHROMAX Z-PAK) 250 MG tablet Take 2 tablets on day 1, then 1 tablet on days 2, 3,  4, 5 01/16/16   Laban Emperor, PA-C  benzonatate (TESSALON PERLES) 100 MG capsule Take 2 capsules (200 mg total) by mouth 3 (three) times daily as needed for cough. 04/06/15 04/05/16  Johnn Hai, PA-C  predniSONE (DELTASONE) 10 MG tablet 40mg  once daily for 4 more days 01/27/16   Lisa Roca, MD   Physical Exam: Vitals:   01/27/16 1400 01/27/16 1444 01/27/16 1445 01/27/16 1500  BP: 116/69 118/63 118/63 126/77  Pulse: 85 88 87 87  Resp:  16    Temp:      TempSrc:      SpO2: 99% 100% 100% 100%  Weight:      Height:         General:  No apparent distress, WDWN, [place]/AT  Eyes: PERRL, EOMI, no scleral icterus, conjunctiva clear  ENT: moist oropharynx without exudate, TM's benign, dentition fair  Neck: supple, no lymphadenopathy. No bruits or thyromegaly  Cardiovascular: regular rate without MRG; 2+ peripheral pulses, no JVD, no peripheral edema  Respiratory: diffuse bilateral rhonchi without wheezes or rales. No dullness. Respiratory effort increased  Abdomen: soft, non tender to palpation, positive bowel sounds, no guarding, no rebound  Skin: no rashes or lesions  Musculoskeletal: normal bulk and tone, no joint swelling  Psychiatric: normal mood and affect, A&OX3  Neurologic: CN 2-12 grossly intact, Motor strength 5/5 in all 4 groups with symmetric DTR's and non-focal sensory exam  Labs on Admission:  Basic Metabolic Panel:  Recent Labs Lab 01/27/16 1430  NA 134*  K 3.6  CL 101  CO2 25  GLUCOSE 108*  BUN 17  CREATININE 0.70  CALCIUM 9.3  Liver Function Tests: No results for input(s): AST, ALT, ALKPHOS, BILITOT, PROT, ALBUMIN in the last 168 hours. No results for input(s): LIPASE, AMYLASE in the last 168 hours. No results for input(s): AMMONIA in the last 168 hours. CBC:  Recent Labs Lab 01/27/16 1430  WBC 17.2*  NEUTROABS 14.0*  HGB 13.8  HCT 40.7  MCV 89.7  PLT 348   Cardiac Enzymes: No results for input(s): CKTOTAL, CKMB, CKMBINDEX, TROPONINI  in the last 168 hours.  BNP (last 3 results) No results for input(s): BNP in the last 8760 hours.  ProBNP (last 3 results) No results for input(s): PROBNP in the last 8760 hours.  CBG: No results for input(s): GLUCAP in the last 168 hours.  Radiological Exams on Admission: Dg Chest 2 View  Result Date: 01/27/2016 CLINICAL DATA:  Diagnosed with pneumonia 2 weeks ago.  Cough. EXAM: CHEST  2 VIEW COMPARISON:  Multiple chest x-rays since July 31, 2014 FINDINGS: The heart, hila, and mediastinum are normal. No pulmonary nodules or masses. No focal infiltrates. IMPRESSION: No active cardiopulmonary disease. Electronically Signed   By: Dorise Bullion III M.D   On: 01/27/2016 12:06   Ct Angio Chest Pe W/cm &/or Wo Cm  Result Date: 01/27/2016 CLINICAL DATA:  Patient was diagnosed with pneumonia 2 weeks ago. Patient finished antibiotics. Patient states that his cough got better for a few days but then symptoms started again. Patient c/o pain with cough, shortness of breath. Productive cough. Rib pain. EXAM: CT ANGIOGRAPHY CHEST WITH CONTRAST TECHNIQUE: Multidetector CT imaging of the chest was performed using the standard protocol during bolus administration of intravenous contrast. Multiplanar CT image reconstructions and MIPs were obtained to evaluate the vascular anatomy. CONTRAST:  75 cc Isovue 370 COMPARISON:  Chest x-ray 01/27/2016 FINDINGS: Cardiovascular: Pulmonary arteries are well opacified. There is no acute pulmonary embolus. Heart size is normal. No imaged pericardial effusion or significant coronary artery calcifications. Aortic arch is normally opacified. Mediastinum/Nodes: The visualized portion of the thyroid gland has a normal appearance. No mediastinal, hilar, or axillary adenopathy. Esophagus is normal. Lungs/Pleura: Nodular, patchy infiltrates are identified within the lower lobes bilaterally, left greater than right and consistent with infectious infiltrates. Perihilar peribronchial  thickening also noted in the lower lobes. Large airways are patent. Upper Abdomen: No acute abnormality. Musculoskeletal: No chest wall abnormality. No acute or significant osseous findings. Review of the MIP images confirms the above findings. IMPRESSION: 1. Significant bilateral lower lobe infiltrates consistent with infectious process. 2. Technically adequate exam showing no acute pulmonary embolus. Electronically Signed   By: Nolon Nations M.D.   On: 01/27/2016 15:47    EKG: Independently reviewed.  Assessment/Plan Principal Problem:   Multifocal pneumonia Active Problems:   Persistent cough   Pleuritic chest pain   SOB (shortness of breath)   Will admit to floor with IV fluids, IV ABX, and IV steroids with SVN's. Cultures sent. Incentive spirometry. Repeat labs in AM.  Diet: regular Fluids: NS@100  DVT Prophylaxis: SQ Heparin  Code Status: FULL  Family Communication: none  Disposition Plan: home  Time spent: 55 min

## 2016-01-27 NOTE — ED Notes (Signed)
Patient leaving for CT scan.

## 2016-01-27 NOTE — Discharge Instructions (Signed)
As we discussed, and your exam and evaluation are reassuring in the emergency department for no ongoing emergency medical condition, however you definitely needs close outpatient follow-up with a primary care doctor and possibly specialist to get to the bottom of your symptoms.  Return to the emergency department for fever, skin rash, dizziness or passing out, new or worsening chest pain or trouble breathing, one-sided weakness or numbness, confusion or altered mental status, or any other symptoms concerning to you.

## 2016-01-27 NOTE — ED Notes (Signed)
Patient used room commode with steady gait. NAD.

## 2016-01-27 NOTE — ED Provider Notes (Signed)
[facility] Emergency Department Provider Note ____________________________________________   I have reviewed the triage vital signs and the triage nursing note.  HISTORY  Chief Complaint Shortness of Breath   Historian Patient and spouse at bedside  HPI Trevor Zimmerman is a 30 y.o. male smoker, who states he has had some cough and shortness of breath and decreased appetite for 4-6 weeks now.  He was seen in the ED about 2 weeks ago and treated for pneumonia that was not seen on x-ray, but was seen at the bases after a CT of the abdomen was obtained. He completed a course of azithromycin. He reports continued symptoms in terms of wheezing and shortness of breath. He is a smoker. He has cut down to 4 cigarettes per day. He's had some chills and sweats without documented fever.  Family states that he had a breast cancer that was removed surgically when he was 30 years old, and only did 2 chemotherapy treatments but has not had symptoms since then.  He is having point tenderness what he states is his left lower rib. He states it's worse with coughing and with palpation and with deep breathing.    Past Medical History:  Diagnosis Date  . Cancer [facility])     Patient Active Problem List   Diagnosis Date Noted  . Community acquired pneumonia of left lower lobe of lung ([place])   . Generalized abdominal pain     Past Surgical History:  Procedure Laterality Date  . 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 Authorizing Provider  albuterol (PROVENTIL HFA;VENTOLIN HFA) 108 (90 Base) MCG/ACT inhaler Inhale 2 puffs into the lungs every 6 (six) hours as needed for wheezing or shortness of breath. 01/27/16   Lisa Roca, MD  azithromycin (ZITHROMAX Z-PAK) 250 MG tablet Take 2 tablets on day 1, then 1 tablet on days 2, 3, 4, 5 01/16/16   Laban Emperor, PA-C  benzonatate (TESSALON PERLES) 100 MG  capsule Take 2 capsules (200 mg total) by mouth 3 (three) times daily as needed for cough. 04/06/15 04/05/16  Johnn Hai, PA-C  predniSONE (DELTASONE) 10 MG tablet 40mg  once daily for 4 more days 01/27/16   Lisa Roca, MD    No Known Allergies  No family history on file.  Social History Social History  Substance Use Topics  . Smoking status: Current Every Day Smoker    Packs/day: 0.50    Types: Cigarettes  . Smokeless tobacco: Never Used  . Alcohol use No    Review of Systems  Constitutional: No documented fevers. Eyes: Negative for visual changes. ENT: Negative for sore throat. Cardiovascular: Positive for left-sided pleuritic chest pain. Respiratory: Positive for wheezing and for shortness of breath. Gastrointestinal: Negative for abdominal pain, vomiting and diarrhea. Genitourinary: Negative for dysuria. Musculoskeletal: Negative for back pain. Skin: Negative for rash. Neurological: Negative for headache. 10 point Review of Systems otherwise negative ____________________________________________   PHYSICAL EXAM:  VITAL SIGNS: ED Triage Vitals  Enc Vitals Group     BP 01/27/16 1107 130/74     Pulse Rate 01/27/16 1107 (!) 109     Resp 01/27/16 1107 16     Temp 01/27/16 1107 99.6 F (37.6 C)     Temp Source 01/27/16 1107 Oral     SpO2 01/27/16 1107 97 %     Weight 01/27/16 1108 121 lb (54.9 kg)  Height 01/27/16 1108 5\' 6"  (1.676 m)     Head Circumference --      Peak Flow --      Pain Score 01/27/16 1108 7     Pain Loc --      Pain Edu? --      Excl. in [place]? --      Constitutional: Alert and oriented. Well appearing and in no distress. HEENT   Head: Normocephalic and atraumatic.      Eyes: Conjunctivae are normal. PERRL. Normal extraocular movements.      Ears:         Nose: No congestion/rhinnorhea.   Mouth/Throat: Mucous membranes are Mildly dry.   Neck: No stridor. Cardiovascular/Chest: Normal rate, regular rhythm.  No murmurs, rubs,  or gallops.  Tenderness to palpation over the rib margins below the nipple on the left side without soft tissue mass, or bony abnormality although patient states that he feels like there is a lump on the bone which appears to just be his rib. Respiratory: Normal respiratory effort without tachypnea nor retractions. Moderate end expiratory wheezing. Mild rhonchi occasionally especially with the cough.   Gastrointestinal: Soft. No distention, no guarding, no rebound. Nontender.    Genitourinary/rectal:Deferred Musculoskeletal: Nontender with normal range of motion in all extremities. No joint effusions.  No lower extremity tenderness.  No edema. Neurologic:  Normal speech and language. No gross or focal neurologic deficits are appreciated. Skin:  Skin is warm, dry and intact. No rash noted. Psychiatric: Mood and affect are normal. Speech and behavior are normal. Patient exhibits appropriate insight and judgment.   ____________________________________________  LABS (pertinent positives/negatives)  Labs Reviewed  BASIC METABOLIC PANEL - Abnormal; Notable for the following:       Result Value   Sodium 134 (*)    Glucose, Bld 108 (*)    All other components within normal limits  CBC WITH DIFFERENTIAL/PLATELET - Abnormal; Notable for the following:    WBC 17.2 (*)    Neutro Abs 14.0 (*)    Monocytes Absolute 1.1 (*)    All other components within normal limits  CULTURE, BLOOD (ROUTINE X 2)  CULTURE, BLOOD (ROUTINE X 2)  LACTIC ACID, PLASMA  LACTIC ACID, PLASMA    ____________________________________________    EKG I, Lisa Roca, MD, the attending physician have personally viewed and interpreted all ECGs.  107 bpm. Sinus tachycardia. Narrow QRS. Normal axis. Nonspecific ST and T-wave ____________________________________________  RADIOLOGY All Xrays were viewed by me. Imaging interpreted by Radiologist.  Chest x-ray two-view: No active cardiopulmonary disease.  CT chest with  contrast for PE: Pending __________________________________________  PROCEDURES  Procedure(s) performed: None  Critical Care performed: None  ____________________________________________   ED COURSE / ASSESSMENT AND PLAN  Pertinent labs & imaging results that were available during my care of the patient were reviewed by me and considered in my medical decision making (see chart for details).   Mr. Titmus is back with persistent respiratory symptoms and on exam he seems like most of his symptoms are probably coming from bronchospasm. He is a smoker and we discussed it may take a while for him to come from upper respiratory infection including recent pneumonia. His chest x-ray is clear and he has no fever here.  He is not hypoxic. His white blood cell count is elevated, but it looks like it has been somewhat chronically elevated as it's been tested in the past.  I did treat him with DuoNeb as well as prednisone for the bronchospasm  in setting of a smoker possible COPD.  Ultimately with the pleuritic chest pain, and tachycardia upon arrival with persistent shortness of breath, I discussed obtaining CT of the chest to rule out PE.  Patient and family are pretty concerned about a possible diagnosis of scleroderma given a family history of the same. I explained to them that diagnosing scleroderma is not part of emergency department treatment, and if he does not have a reason for hospitalization, this is definitely something he should follow-up with a primary care doctor and possibly have referral to specialist as well.  On CT scan, no PE, but bilateral lower lobe infiltrates consistent with pneumonia, and with elevated white blood cell count, I'm concerned about failed outpatient therapy for pneumonia. Given his intermittent tachycardia, and the failed outpatient pneumonia, I am going to give him a dose of vancomycin and Zosyn and admitted to the hospitalist after blood cultures.  He does not  appear to be septic. I will add on a lactate, based had no hypotension. He is receiving a second liter fluid for pneumonia.    CONSULTATIONS:   Hospitalist for admission.   Patient / Family / Caregiver informed of clinical course, medical decision-making process, and agree with plan.   ___________________________________________   FINAL CLINICAL IMPRESSION(S) / ED DIAGNOSES   Final diagnoses:  Bronchospasm  Chest pain, unspecified type  Dyspnea, unspecified type  Community acquired pneumonia, unspecified laterality              Note: This dictation was prepared with Sales executive. Any transcriptional errors that result from this process are unintentional    Lisa Roca, MD 01/27/16 1553

## 2016-01-27 NOTE — Progress Notes (Signed)
Notified Dr. Joseph Art of patient's current pain level of 7/10 in rib cage with only tylenol ordered for mild pain. MD stated that she would look and put in orders.

## 2016-01-28 ENCOUNTER — Inpatient Hospital Stay: Payer: Medicaid Other

## 2016-01-28 LAB — COMPREHENSIVE METABOLIC PANEL
ALBUMIN: 3.4 g/dL — AB (ref 3.5–5.0)
ALT: 17 U/L (ref 17–63)
ANION GAP: 8 (ref 5–15)
AST: 18 U/L (ref 15–41)
Alkaline Phosphatase: 48 U/L (ref 38–126)
BUN: 14 mg/dL (ref 6–20)
CHLORIDE: 108 mmol/L (ref 101–111)
CO2: 21 mmol/L — AB (ref 22–32)
Calcium: 9.2 mg/dL (ref 8.9–10.3)
Creatinine, Ser: 0.6 mg/dL — ABNORMAL LOW (ref 0.61–1.24)
GFR calc non Af Amer: >60 mL/min (ref >60)
GLUCOSE: 142 mg/dL — AB (ref 65–99)
POTASSIUM: 4.4 mmol/L (ref 3.5–5.1)
SODIUM: 137 mmol/L (ref 135–145)
Total Bilirubin: 0.6 mg/dL (ref 0.3–1.2)
Total Protein: 6.7 g/dL (ref 6.5–8.1)

## 2016-01-28 LAB — CBC
HEMATOCRIT: 40.7 % (ref 40.0–52.0)
HEMOGLOBIN: 13.7 g/dL (ref 13.0–18.0)
MCH: 30.7 pg (ref 26.0–34.0)
MCHC: 33.7 g/dL (ref 32.0–36.0)
MCV: 91.1 fL (ref 80.0–100.0)
Platelets: 311 10*3/uL (ref 150–440)
RBC: 4.47 MIL/uL (ref 4.40–5.90)
RDW: 13 % (ref 11.5–14.5)
WBC: 21.5 10*3/uL — ABNORMAL HIGH (ref 3.8–10.6)

## 2016-01-28 LAB — PROCALCITONIN

## 2016-01-28 LAB — TROPONIN I: Troponin I: <0.03 ng/mL (ref <0.03)

## 2016-01-28 LAB — MRSA PCR SCREENING: MRSA BY PCR: POSITIVE — AB

## 2016-01-28 MED ORDER — LEVOFLOXACIN 500 MG PO TABS
750.0000 mg | ORAL_TABLET | Freq: Every day | ORAL | Status: DC
  Administered 2016-01-28 – 2016-01-29 (×2): 750 mg via ORAL
  Filled 2016-01-28 (×2): qty 2

## 2016-01-28 MED ORDER — FUROSEMIDE 10 MG/ML IJ SOLN
20.0000 mg | Freq: Once | INTRAMUSCULAR | Status: AC
  Administered 2016-01-28: 20 mg via INTRAVENOUS
  Filled 2016-01-28: qty 2

## 2016-01-28 MED ORDER — CHLORHEXIDINE GLUCONATE CLOTH 2 % EX PADS: 6.0000 | MEDICATED_PAD | Freq: Every day | CUTANEOUS | Status: DC

## 2016-01-28 MED ORDER — POTASSIUM CHLORIDE CRYS ER 20 MEQ PO TBCR
20.0000 meq | EXTENDED_RELEASE_TABLET | Freq: Once | ORAL | Status: AC
  Administered 2016-01-28: 20 meq via ORAL
  Filled 2016-01-28: qty 1

## 2016-01-28 MED ORDER — NICOTINE 21 MG/24HR TD PT24
21.0000 mg | MEDICATED_PATCH | Freq: Every day | TRANSDERMAL | Status: DC
  Administered 2016-01-28 – 2016-01-29 (×2): 21 mg via TRANSDERMAL
  Filled 2016-01-28 (×2): qty 1

## 2016-01-28 MED ORDER — MUPIROCIN 2 % EX OINT
1.0000 "application " | TOPICAL_OINTMENT | Freq: Two times a day (BID) | CUTANEOUS | Status: DC
  Administered 2016-01-28 – 2016-01-29 (×3): 1 via NASAL
  Filled 2016-01-28 (×2): qty 22

## 2016-01-28 NOTE — Progress Notes (Signed)
Notified Dr. Dimas Chyle of positive MRSA swab will place order for contact and positive protocol. Per MD saline lock IV and place order for nicotine patch 21mg 

## 2016-01-28 NOTE — Progress Notes (Signed)
[facility] [place] NAME: Trevor Zimmerman    MR#:  [id number]  DATE OF BIRTH:  Nov 27, 1985  SUBJECTIVE:  CHIEF COMPLAINT:   Chief Complaint  Patient presents with  . Shortness of Breath   The patient is sedated-30-year-old Caucasian male with past medical history significant for history of chest area tumor, who presents to the hospital with complaints of shortness of breath, cough, pleuritic chest pain. Apparently, patient was treated with Z-Pak about a week ago for pneumonia with no significant improvement of his symptoms, on presentation to the emergency room. His white cell count was found to be 17,000 and CT revealed bilateral pneumonia and so patient was admitted. He admits of smoking tobacco for the past 12 years, trying to cut down. He feels much better today, less pain in the chest, shortness of breath. Review of Systems  Constitutional: Negative for chills, fever and weight loss.  HENT: Negative for congestion.   Eyes: Negative for blurred vision and double vision.  Respiratory: Positive for cough, sputum production and shortness of breath. Negative for wheezing.   Cardiovascular: Positive for chest pain. Negative for palpitations, orthopnea, leg swelling and PND.  Gastrointestinal: Negative for abdominal pain, blood in stool, constipation, diarrhea, nausea and vomiting.  Genitourinary: Negative for dysuria, frequency, hematuria and urgency.  Musculoskeletal: Negative for falls.  Neurological: Negative for dizziness, tremors, focal weakness and headaches.  Endo/Heme/Allergies: Does not bruise/bleed easily.  Psychiatric/Behavioral: Negative for depression. The patient does not have insomnia.     VITAL SIGNS: Blood pressure 100/61, pulse 100, temperature 98.2 F (36.8 C), temperature source Oral, resp. rate 16, height 5\' 6"  (1.676 m), weight 55.9 kg (123 lb 3.2 oz), SpO2 97 %.  PHYSICAL EXAMINATION:   GENERAL:  30  y.o.-year-old patient lying in the bed with no acute distress. Pale EYES: Pupils equal, round, reactive to light and accommodation. No scleral icterus. Extraocular muscles intact.  HEENT: Head atraumatic, normocephalic. Oropharynx and nasopharynx clear.  NECK:  Supple, no jugular venous distention. No thyroid enlargement, no tenderness.  LUNGS: Some diminished breath sounds bilaterally, no wheezing, bilateral diffuse anterior and posterior rales,rhonchi and crepitations . No use of accessory muscles of respiration.  CARDIOVASCULAR: S1, S2 normal. No murmurs, rubs, or gallops.  ABDOMEN: Soft, nontender, nondistended. Bowel sounds present. No organomegaly or mass.  EXTREMITIES: No pedal edema, cyanosis, or clubbing.  NEUROLOGIC: Cranial nerves II through XII are intact. Muscle strength 5/5 in all extremities. Sensation intact. Gait not checked.  PSYCHIATRIC: The patient is alert and oriented x 3.  SKIN: No obvious rash, lesion, or ulcer. Multiple tattoos   ORDERS/RESULTS REVIEWED:   CBC  Recent Labs Lab 01/27/16 1430 01/28/16 0510  WBC 17.2* 21.5*  HGB 13.8 13.7  HCT 40.7 40.7  PLT 348 311  MCV 89.7 91.1  MCH 30.3 30.7  MCHC 33.8 33.7  RDW 13.2 13.0  LYMPHSABS 1.9  --   MONOABS 1.1*  --   EOSABS 0.1  --   BASOSABS 0.1  --    ------------------------------------------------------------------------------------------------------------------  Chemistries   Recent Labs Lab 01/27/16 1430 01/28/16 0510  NA 134* 137  K 3.6 4.4  CL 101 108  CO2 25 21*  GLUCOSE 108* 142*  BUN 17 14  CREATININE 0.70 0.60*  CALCIUM 9.3 9.2  AST  --  18  ALT  --  17  ALKPHOS  --  48  BILITOT  --  0.6   ------------------------------------------------------------------------------------------------------------------ estimated creatinine clearance is 108.7 mL/min (  by C-G formula based on SCr of 0.6 mg/dL  (L)). ------------------------------------------------------------------------------------------------------------------ No results for input(s): TSH, T4TOTAL, T3FREE, THYROIDAB in the last 72 hours.  Invalid input(s): FREET3  Cardiac Enzymes No results for input(s): CKMB, TROPONINI, MYOGLOBIN in the last 168 hours.  Invalid input(s): CK ------------------------------------------------------------------------------------------------------------------ Invalid input(s): POCBNP ---------------------------------------------------------------------------------------------------------------  RADIOLOGY: X-ray Chest Pa And Lateral  Result Date: 01/28/2016 CLINICAL DATA:  Admitted yesterday with pneumonia. History of breast cancer. EXAM: CHEST  2 VIEW COMPARISON:  01/27/2016 and chest CT 01/27/2016 FINDINGS: Lungs are adequately inflated with mild patchy opacification in the lung bases and over the anterior upper lungs on the lateral film worse compared to the previous chest x-ray and as seen on the recent chest CT as findings are compatible with pneumonia. No significant effusion. Cardiomediastinal silhouette and remainder of the exam is unchanged. IMPRESSION: Mild multifocal airspace process as seen on recent CT compatible with pneumonia. Electronically Signed   By: Marin Olp M.D.   On: 01/28/2016 08:10   Dg Chest 2 View  Result Date: 01/27/2016 CLINICAL DATA:  Diagnosed with pneumonia 2 weeks ago.  Cough. EXAM: CHEST  2 VIEW COMPARISON:  Multiple chest x-rays since July 31, 2014 FINDINGS: The heart, hila, and mediastinum are normal. No pulmonary nodules or masses. No focal infiltrates. IMPRESSION: No active cardiopulmonary disease. Electronically Signed   By: Dorise Bullion III M.D   On: 01/27/2016 12:06   Ct Angio Chest Pe W/cm &/or Wo Cm  Result Date: 01/27/2016 CLINICAL DATA:  Patient was diagnosed with pneumonia 2 weeks ago. Patient finished antibiotics. Patient states that his cough got  better for a few days but then symptoms started again. Patient c/o pain with cough, shortness of breath. Productive cough. Rib pain. EXAM: CT ANGIOGRAPHY CHEST WITH CONTRAST TECHNIQUE: Multidetector CT imaging of the chest was performed using the standard protocol during bolus administration of intravenous contrast. Multiplanar CT image reconstructions and MIPs were obtained to evaluate the vascular anatomy. CONTRAST:  75 cc Isovue 370 COMPARISON:  Chest x-ray 01/27/2016 FINDINGS: Cardiovascular: Pulmonary arteries are well opacified. There is no acute pulmonary embolus. Heart size is normal. No imaged pericardial effusion or significant coronary artery calcifications. Aortic arch is normally opacified. Mediastinum/Nodes: The visualized portion of the thyroid gland has a normal appearance. No mediastinal, hilar, or axillary adenopathy. Esophagus is normal. Lungs/Pleura: Nodular, patchy infiltrates are identified within the lower lobes bilaterally, left greater than right and consistent with infectious infiltrates. Perihilar peribronchial thickening also noted in the lower lobes. Large airways are patent. Upper Abdomen: No acute abnormality. Musculoskeletal: No chest wall abnormality. No acute or significant osseous findings. Review of the MIP images confirms the above findings. IMPRESSION: 1. Significant bilateral lower lobe infiltrates consistent with infectious process. 2. Technically adequate exam showing no acute pulmonary embolus. Electronically Signed   By: Nolon Nations M.D.   On: 01/27/2016 15:47    EKG:  Orders placed or performed during the hospital encounter of 01/27/16  . ED EKG  . ED EKG  . EKG 12-Lead  . EKG 12-Lead    ASSESSMENT AND PLAN:  Principal Problem:   Multifocal pneumonia Active Problems:   Persistent cough   Pleuritic chest pain   SOB (shortness of breath) #1. Multifocal pneumonia, continue patient on broad-spectrum antibiotic therapy, get sputum cultures if possible, I  just antibiotics depending on culture results, get procalcitonin #2. Chest pain, likely due to work of breathing, get BNP and troponin, echocardiogram #3. Hyponatremia, resolved on IV fluid administration #4. Leukocytosis, likely  due to steroids, follow closely #5. Tobacco abuse. Counseling, discussed this patient for approximately 5 minutes, nicotine replacement therapy will be initiated, he is agreeable   Management plans discussed with the patient, family and they are in agreement.   DRUG ALLERGIES: No Known Allergies  CODE STATUS:     Code Status Orders        Start     Ordered   01/27/16 1757  Full code  Continuous     01/27/16 1756    Code Status History    Date Active Date Inactive Code Status Order ID Comments User Context   This patient has a current code status but no historical code status.      TOTAL TIME TAKING CARE OF THIS PATIENT: 40 minutes.    Theodoro Grist M.D on 01/28/2016 at 2:59 PM  Between 7am to 6pm - Pager - [phone]  After 6pm go to [email or web address] - password [email or web address] [facility]  [place] [facility]  Office  [phone]  CC: Primary care physician; No PCP Per Patient

## 2016-01-29 ENCOUNTER — Inpatient Hospital Stay
Admit: 2016-01-29 | Discharge: 2016-01-29 | Disposition: A | Discharge status: Home / Self Care | Payer: Medicaid Other | Attending: Internal Medicine | Admitting: Internal Medicine

## 2016-01-29 DIAGNOSIS — E8770 Fluid overload, unspecified: Secondary | ICD-10-CM

## 2016-01-29 DIAGNOSIS — E871 Hypo-osmolality and hyponatremia: Secondary | ICD-10-CM

## 2016-01-29 DIAGNOSIS — J441 Chronic obstructive pulmonary disease with (acute) exacerbation: Secondary | ICD-10-CM

## 2016-01-29 DIAGNOSIS — Z716 Tobacco abuse counseling: Secondary | ICD-10-CM

## 2016-01-29 DIAGNOSIS — D72829 Elevated white blood cell count, unspecified: Secondary | ICD-10-CM

## 2016-01-29 LAB — URINE DRUG SCREEN, QUALITATIVE (ARMC ONLY)
Amphetamines, Ur Screen: NOT DETECTED
BARBITURATES, UR SCREEN: NOT DETECTED
BENZODIAZEPINE, UR SCRN: NOT DETECTED
CANNABINOID 50 NG, UR ~~LOC~~: POSITIVE — AB
Cocaine Metabolite,Ur ~~LOC~~: NOT DETECTED
MDMA (Ecstasy)Ur Screen: NOT DETECTED
METHADONE SCREEN, URINE: NOT DETECTED
Opiate, Ur Screen: POSITIVE — AB
Phencyclidine (PCP) Ur S: NOT DETECTED
TRICYCLIC, UR SCREEN: NOT DETECTED

## 2016-01-29 LAB — CBC
HCT: 36.4 % — ABNORMAL LOW (ref 40.0–52.0)
HEMOGLOBIN: 12.2 g/dL — AB (ref 13.0–18.0)
MCH: 30.3 pg (ref 26.0–34.0)
MCHC: 33.6 g/dL (ref 32.0–36.0)
MCV: 90.2 fL (ref 80.0–100.0)
PLATELETS: 305 10*3/uL (ref 150–440)
RBC: 4.04 MIL/uL — AB (ref 4.40–5.90)
RDW: 13.1 % (ref 11.5–14.5)
WBC: 27.9 10*3/uL — AB (ref 3.8–10.6)

## 2016-01-29 LAB — RAPID HIV SCREEN (HIV 1/2 AB+AG)
HIV 1/2 Antibodies: NONREACTIVE
HIV-1 P24 ANTIGEN - HIV24: NONREACTIVE

## 2016-01-29 LAB — TROPONIN I: Troponin I: <0.03 ng/mL (ref <0.03)

## 2016-01-29 LAB — ECHOCARDIOGRAM COMPLETE
Height: 66 in
Weight: 1828.8 oz

## 2016-01-29 LAB — BRAIN NATRIURETIC PEPTIDE: B NATRIURETIC PEPTIDE 5: 70 pg/mL (ref 0.0–100.0)

## 2016-01-29 MED ORDER — PREDNISONE 10 MG (21) PO TBPK: 10.0000 mg | ORAL_TABLET | Freq: Every day | ORAL | 0 refills | Status: DC

## 2016-01-29 MED ORDER — HYDROCOD POLST-CPM POLST ER 10-8 MG/5ML PO SUER: 5.0000 mL | Freq: Two times a day (BID) | ORAL | 0 refills | Status: DC | PRN

## 2016-01-29 MED ORDER — MUPIROCIN 2 % EX OINT: 1.0000 "application " | TOPICAL_OINTMENT | Freq: Two times a day (BID) | CUTANEOUS | 0 refills | Status: DC

## 2016-01-29 MED ORDER — NICOTINE 21 MG/24HR TD PT24: 21.0000 mg | MEDICATED_PATCH | Freq: Every day | TRANSDERMAL | 0 refills | Status: DC

## 2016-01-29 MED ORDER — GUAIFENESIN ER 600 MG PO TB12: 1200.0000 mg | ORAL_TABLET | Freq: Two times a day (BID) | ORAL | 0 refills | Status: DC

## 2016-01-29 MED ORDER — LEVOFLOXACIN 750 MG PO TABS: 750.0000 mg | ORAL_TABLET | Freq: Every day | ORAL | 0 refills | Status: DC

## 2016-01-29 MED ORDER — IPRATROPIUM-ALBUTEROL 0.5-2.5 (3) MG/3ML IN SOLN: 3.0000 mL | Freq: Four times a day (QID) | RESPIRATORY_TRACT | 5 refills | Status: DC

## 2016-01-29 NOTE — Discharge Summary (Addendum)
[facility] [place] NAME: Trevor Zimmerman    MR#:  [id number]  DATE OF BIRTH:  1985/09/21  DATE OF ADMISSION:  01/27/2016 ADMITTING PHYSICIAN: Idelle Crouch, MD  DATE OF DISCHARGE: No discharge date for patient encounter.  PRIMARY CARE PHYSICIAN: No PCP Per Patient     ADMISSION DIAGNOSIS:  Bronchospasm [J98.01] Dyspnea, unspecified type [R06.00] Chest pain, unspecified type [R07.9] Community acquired pneumonia, unspecified laterality [J18.9]  DISCHARGE DIAGNOSIS:  Principal Problem:   Multifocal pneumonia Active Problems:   Persistent cough   Pleuritic chest pain   SOB (shortness of breath)   Leukocytosis   Hyponatremia   Tobacco abuse counseling   COPD exacerbation (HCC)   Fluid overload   SECONDARY DIAGNOSIS:   Past Medical History:  Diagnosis Date  . Cancer ([facility])     .pro HOSPITAL COURSE:  The patient is sedated-year-old Caucasian male with past medical history significant for history of chest area tumor, Frequent pneumonias, who presents to the hospital with complaints of shortness of breath, cough, pleuritic chest pain. Apparently, patient was treated with Z-Pak about a week ago for pneumonia with no significant improvement of his symptoms. On presentation to the emergency room, his white cell count was found to be 17,000 and CT revealed bilateral pneumonia and so patient was admitted. He admited of smoking tobacco for the past 12 years, trying to cut down.He was initiated on broad-spectrum antibiotic therapy and clinically improved. He was felt to be stable to be discharged home today. He was seen by Dr. Ola Spurr, infectious disease specialist who recommended to continue antibiotic therapy with levofloxacin for 12 more days to complete 14 day course and follow-up with him as outpatient.  Discussion by problem: #1. Multifocal pneumonia, continue Levaquin for 12 more days, per Dr. Blane Ohara  recommendations, follow-up with Dr. Ola Spurr in about 1-2 weeks after discharge. Echocardiogram was normal. Urine drug screen is pending read #2. Chest pain, likely due to work of breathing, BNP and troponin, echocardiogram were normal. CT angiogram revealed pneumonia, no pulmonary embolism. #3. Hyponatremia, resolved on IV fluid administration #4. Leukocytosis, ? due to steroids, follow closely as outpatient #5. Tobacco abuse. 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 Qty: 140 mL, Refills: 0    guaiFENesin (MUCINEX) 600 MG 12 hr tablet Take 2 tablets (1,200 mg total) by mouth 2 (two) times daily. Qty: 20 tablet, Refills: 0    levofloxacin (LEVAQUIN) 750 MG tablet Take 1 tablet (750 mg total) by mouth daily. Qty: 12 tablet, Refills: 0    mupirocin ointment (BACTROBAN) 2 % Place 1 application into the nose 2 (two) times daily. Qty: 22 g, Refills: 0    nicotine (NICODERM CQ - DOSED IN MG/24 HOURS) 21 mg/24hr patch Place 1 patch (21 mg total) onto the skin daily. Qty: 28 patch, Refills: 0    predniSONE (STERAPRED UNI-PAK 21 TAB) 10 MG (21) TBPK tablet Take 1 tablet (10 mg total) by mouth daily. Please take 6 pills in the morning on the day 1,  then taper by one pill daily until finished, thank you Qty: 21 tablet, Refills: 0      CONTINUE these medications which have CHANGED   Details  albuterol (PROVENTIL HFA;VENTOLIN HFA) 108 (90 Base) MCG/ACT inhaler Inhale 2 puffs into the lungs every 6  (six) hours as needed for wheezing or shortness of breath. Qty: 1 Inhaler, Refills: 0      STOP taking these medications     azithromycin (ZITHROMAX Z-PAK) 250 MG tablet      benzonatate (TESSALON PERLES) 100 MG capsule          DISCHARGE INSTRUCTIONS:    Patient is to follow-up with Dr. Ola Spurr in 1-2 weeks after discharge  If you experience worsening of your admission symptoms, develop shortness of breath, life threatening emergency, suicidal or homicidal thoughts you must seek medical attention immediately by calling 911 or calling your MD immediately  if symptoms less severe.  You Must read complete instructions/literature along with all the possible adverse reactions/side effects for all the Medicines you take and that have been prescribed to you. Take any new Medicines after you have completely understood and accept all the possible adverse reactions/side effects.   Please note  You were cared for by a hospitalist during your hospital stay. If you have any questions about your discharge medications or the care you received while you were in the hospital after you are discharged, you can call the unit and asked to speak with the hospitalist on call if the hospitalist that took care of you is not available. Once you are discharged, your primary care physician will handle any further medical issues. Please note that NO REFILLS for any discharge medications will be authorized once you are discharged, as it is imperative that you return to your primary care physician (or establish a relationship with a primary care physician if you do not have one) for your aftercare needs so that they can reassess your need for medications and monitor your lab values.    Today   CHIEF COMPLAINT:   Chief Complaint  Patient presents with  . Shortness of Breath    HISTORY OF PRESENT ILLNESS:  Trevor Zimmerman  is a 30 y.o. male with a known history of chest area tumor, frequent pneumonias,  who presents to the hospital with complaints of shortness of breath, cough, pleuritic chest pain. Apparently, patient was treated with Z-Pak about a week ago for pneumonia with no significant improvement of his symptoms. On presentation to the emergency room, his white cell count was found to be 17,000 and CT revealed bilateral pneumonia and so patient was admitted. He admited of smoking tobacco for the past 12 years, trying to cut down.He was initiated on broad-spectrum antibiotic therapy and clinically improved. He was felt to be stable to be discharged home today. He was seen by Dr. Ola Spurr, infectious disease specialist who recommended to continue antibiotic therapy with levofloxacin for 12 more days to complete 14 day course and follow-up with him as outpatient.  Discussion by problem: #1. Multifocal pneumonia, continue Levaquin for 12 more days, per Dr. Blane Ohara recommendations, follow-up with Dr. Ola Spurr in about 1-2 weeks after discharge. Echocardiogram was normal. Urine drug screen is pending read #2. Chest pain, likely due to work of breathing, BNP and troponin, echocardiogram were normal. CT angiogram revealed pneumonia, no pulmonary embolism. #3. Hyponatremia, resolved on IV fluid administration #4. Leukocytosis, ? due to steroids, follow closely as outpatient #5. Tobacco abuse. 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 rate 20, height 5\' 6"  (1.676 m), weight 51.8 kg (114 lb 4.8 oz), SpO2 100 %.  I/O:  No intake or output data in the 24 hours ending 01/29/16 1816  PHYSICAL EXAMINATION:  GENERAL:  30 y.o.-year-old patient lying in the bed with no acute distress.  EYES: Pupils equal, round, reactive to light and accommodation. No scleral  icterus. Extraocular muscles intact.  HEENT: Head atraumatic, normocephalic. Oropharynx and nasopharynx clear.  NECK:  Supple, no jugular venous distention. No thyroid enlargement, no tenderness.  LUNGS: Normal breath sounds bilaterally, no wheezing, rales,rhonchi or crepitation. No use of accessory muscles of respiration.  CARDIOVASCULAR: S1, S2 normal. No murmurs, rubs, or gallops.  ABDOMEN: Soft, non-tender, non-distended. Bowel sounds present. No organomegaly or mass.  EXTREMITIES: No pedal edema, cyanosis, or clubbing.  NEUROLOGIC: Cranial nerves II through XII are intact. Muscle strength 5/5 in all extremities. Sensation intact. Gait not checked.  PSYCHIATRIC: The patient is alert and oriented x 3.  SKIN: No obvious rash, lesion, or ulcer.   DATA REVIEW:   CBC  Recent Labs Lab 01/29/16 0630  WBC 27.9*  HGB 12.2*  HCT 36.4*  PLT 305    Chemistries   Recent Labs Lab 01/28/16 0510  NA 137  K 4.4  CL 108  CO2 21*  GLUCOSE 142*  BUN 14  CREATININE 0.60*  CALCIUM 9.2  AST 18  ALT 17  ALKPHOS 48  BILITOT 0.6    Cardiac Enzymes  Recent Labs Lab 01/28/16 2320  TROPONINI <0.03    Microbiology Results  Results for orders placed or performed during the hospital encounter of 01/27/16  Culture, blood (routine x 2)     Status: None (Preliminary result)   Collection Time: 01/27/16  4:45 PM  Result Value Ref Range Status   Specimen Description BLOOD RIGHT ANTECUBITAL  Final   Special Requests   Final    BOTTLES DRAWN AEROBIC AND ANAEROBIC  AER 8CC ANA 11CC   Culture NO GROWTH 2 DAYS  Final   Report Status PENDING  Incomplete  Culture, blood (routine x 2)     Status: None (Preliminary result)   Collection Time: 01/27/16  4:45 PM  Result Value Ref Range Status   Specimen Description BLOOD LEFT FOREARM  Final   Special Requests   Final    BOTTLES DRAWN AEROBIC AND ANAEROBIC  AER 10CC ANA 12CC   Culture NO GROWTH 2 DAYS  Final   Report Status PENDING  Incomplete   MRSA PCR Screening     Status: Abnormal   Collection Time: 01/28/16  9:24 AM  Result Value Ref Range Status   MRSA by PCR POSITIVE (A) NEGATIVE Final    Comment:        The GeneXpert MRSA Assay (FDA approved for NASAL specimens only), is one component of a comprehensive MRSA colonization surveillance program. It is not intended to diagnose MRSA infection nor to guide or monitor treatment for MRSA infections. RESULT CALLED TO, READ BACK BY AND VERIFIED WITH: ABBEY GOODMAN ON 01/28/16 AT 1036 QSD     RADIOLOGY:  X-ray Chest Pa And Lateral  Result Date: 01/28/2016 CLINICAL DATA:  Admitted yesterday with pneumonia. History of breast cancer. EXAM: CHEST  2 VIEW COMPARISON:  01/27/2016 and chest CT 01/27/2016 FINDINGS: Lungs are  adequately inflated with mild patchy opacification in the lung bases and over the anterior upper lungs on the lateral film worse compared to the previous chest x-ray and as seen on the recent chest CT as findings are compatible with pneumonia. No significant effusion. Cardiomediastinal silhouette and remainder of the exam is unchanged. IMPRESSION: Mild multifocal airspace process as seen on recent CT compatible with pneumonia. Electronically Signed   By: Marin Olp M.D.   On: 01/28/2016 08:10    EKG:   Orders placed or performed during the hospital encounter of 01/27/16  . ED EKG  . ED EKG  . EKG 12-Lead  . EKG 12-Lead      Management plans discussed with the patient, family and they are in agreement.  CODE STATUS:     Code Status Orders        Start     Ordered   01/27/16 1757  Full code  Continuous     01/27/16 1756    Code Status History    Date Active Date Inactive Code Status Order ID Comments User Context   This patient has a current code status but no historical code status.      TOTAL TIME TAKING CARE OF THIS PATIENT: 40 minutes.    Theodoro Grist M.D on 01/29/2016 at 6:16 PM  Between 7am to 6pm - Pager -  [phone]  After 6pm go to [email or web address] - password [email or web address] [facility]  [place] [facility]  Office  [phone]  CC: Primary care physician; No PCP Per Patient

## 2016-01-29 NOTE — Progress Notes (Signed)
ID Brief note Pt seen and reveiwed chart. Full note to follow Would dc on levofloxacin 750 qd for 12 more days I can see in fu and repeat cxr and evaluate for underlying etiology

## 2016-01-29 NOTE — Progress Notes (Signed)
*  PRELIMINARY RESULTS* Echocardiogram 2D Echocardiogram has been performed.  Trevor Zimmerman 01/29/2016, 2:12 PM

## 2016-01-29 NOTE — Consult Note (Signed)
[facility]ious [facility]     Reason for Consult: Recurrent PNA    Referring Physician: Seth Bake Date of Admission:  01/27/2016   Principal Problem:   Multifocal pneumonia Active Problems:   Persistent cough   Pleuritic chest pain   SOB (shortness of breath)   Leukocytosis   Hyponatremia   Tobacco abuse counseling   Fluid overload   COPD exacerbation (HCC)   HPI: Trevor Zimmerman is a 30 y.o. male admitted with bilateral pleuritic chest pain and cough with SOB. On admit wbc 17, ct with bilateral infiltrates noted. He initially reported brown sputum but no sample obtained. He has defervesed and reports decreased cough . Treated with vanoc zosyn then changed to oral levofloxacin.  [place] neg. HIV neg.  He had an episode of abd pain and seen in ED 11/28. CT showed possible appendicitis.  He was seen by surgery but felt imaging not consistent with his exam and was dc on oral augmentin. He states he took the augmentin with improvement but then worsened once off abx. His wife reports at one point he awoke in the middle of the night choking but otherwise he denies dysphagia or GERD sxs. He does smoke up to 1 and 1/2 ppd, occas marijuana. Denies IVDU.  Works [employer]. Lives with his wife and 5 children.  He also reports occas discoloration of his R 5th toe.  Mother has hx scleroderma.  Past Medical History:  Diagnosis Date  . Cancer [facility])    Past Surgical History:  Procedure Laterality Date  . 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)     Social History  Substance Use Topics  . Smoking status: Current Every Day Smoker    Packs/day: 0.25    Types: Cigarettes  . Smokeless tobacco: Never Used  . Alcohol use No   History reviewed. No pertinent family history.  Allergies: No Known Allergies  Current antibiotics: Antibiotics Given (last 72 hours)    Date/Time Action Medication Dose Rate   01/27/16 2336 Given   vancomycin (VANCOCIN) IVPB 750 mg/150 ml premix 750 mg 150  mL/hr   01/27/16 2336 Given   piperacillin-tazobactam (ZOSYN) IVPB 3.375 g 3.375 g 12.5 mL/hr   01/28/16 0272 Given   piperacillin-tazobactam (ZOSYN) IVPB 3.375 g 3.375 g 12.5 mL/hr   01/28/16 5366 Given   vancomycin (VANCOCIN) IVPB 750 mg/150 ml premix 750 mg 150 mL/hr   01/28/16 1550 Given   vancomycin (VANCOCIN) IVPB 750 mg/150 ml premix 750 mg 150 mL/hr   01/28/16 1550 Given   piperacillin-tazobactam (ZOSYN) IVPB 3.375 g 3.375 g 12.5 mL/hr   01/28/16 2136 Given   levofloxacin (LEVAQUIN) tablet 750 mg 750 mg    01/29/16 0849 Given   levofloxacin (LEVAQUIN) tablet 750 mg 750 mg       MEDICATIONS: . Chlorhexidine Gluconate Cloth  6 each Topical Q0600  . docusate sodium  100 mg Oral BID  . guaiFENesin  1,200 mg Oral BID  . heparin  5,000 Units Subcutaneous Q8H  . ipratropium-albuterol  3 mL Nebulization QID  . levofloxacin  750 mg Oral Daily  . mupirocin ointment  1 application Nasal BID  . nicotine  21 mg Transdermal Daily  . pantoprazole  40 mg Oral Daily    Review of Systems - 11 systems reviewed and negative per HPI   OBJECTIVE: Temp:  [97.5 F (36.4 C)-98.1 F (36.7 C)] 98.1 F (36.7 C) (12/11 1440) Pulse Rate:  [74-90] 85 (12/11 1440) Resp:  [18-20] 20 (12/11 1440)  BP: (113-137)/(63-89) 118/75 (12/11 1440) SpO2:  [96 %-100 %] 100 % (12/11 1440) Weight:  [51.8 kg (114 lb 4.8 oz)] 51.8 kg (114 lb 4.8 oz) (12/11 0500) Physical Exam  Constitutional: He is oriented to person, place, and time. Thin appears well-developed and well-nourished. No distress.  HENT: anicteric Mouth/Throat: Oropharynx is clear and moist. No oropharyngeal exudate.  Cardiovascular: Normal rate, regular rhythm and normal heart sounds. Pulmonary/Chest:poor air movement, rhonchi bilaterally and some wheeze exp.   Abdominal: Soft. Bowel sounds are normal. He exhibits no distension. There is no tenderness.  Lymphadenopathy: He has no cervical adenopathy.  Neurological: He is alert and oriented to  person, place, and time.  Skin: Skin is warm and dry. No rash noted. Multiple hand scaratches Psychiatric: He has a normal mood and affect. His behavior is normal.   LABS: Results for orders placed or performed during the hospital encounter of 01/27/16 (from the past 48 hour(s))  Lactic acid, plasma     Status: None   Collection Time: 01/27/16  4:45 PM  Result Value Ref Range   Lactic Acid, Venous 1.1 0.5 - 1.9 mmol/L  Culture, blood (routine x 2)     Status: None (Preliminary result)   Collection Time: 01/27/16  4:45 PM  Result Value Ref Range   Specimen Description BLOOD RIGHT ANTECUBITAL    Special Requests      BOTTLES DRAWN AEROBIC AND ANAEROBIC  AER 8CC ANA 11CC   Culture NO GROWTH 2 DAYS    Report Status PENDING   Culture, blood (routine x 2)     Status: None (Preliminary result)   Collection Time: 01/27/16  4:45 PM  Result Value Ref Range   Specimen Description BLOOD LEFT FOREARM    Special Requests      BOTTLES DRAWN AEROBIC AND ANAEROBIC  AER 10CC ANA 12CC   Culture NO GROWTH 2 DAYS    Report Status PENDING   Comprehensive metabolic panel     Status: Abnormal   Collection Time: 01/28/16  5:10 AM  Result Value Ref Range   Sodium 137 135 - 145 mmol/L   Potassium 4.4 3.5 - 5.1 mmol/L   Chloride 108 101 - 111 mmol/L   CO2 21 (L) 22 - 32 mmol/L   Glucose, Bld 142 (H) 65 - 99 mg/dL   BUN 14 6 - 20 mg/dL   Creatinine, Ser 0.60 (L) 0.61 - 1.24 mg/dL   Calcium 9.2 8.9 - 10.3 mg/dL   Total Protein 6.7 6.5 - 8.1 g/dL   Albumin 3.4 (L) 3.5 - 5.0 g/dL   AST 18 15 - 41 U/L   ALT 17 17 - 63 U/L   Alkaline Phosphatase 48 38 - 126 U/L   Total Bilirubin 0.6 0.3 - 1.2 mg/dL   GFR calc non Af Amer >60 >60 mL/min   GFR calc Af Amer >60 >60 mL/min    Comment: (NOTE) The eGFR has been calculated using the CKD EPI equation. This calculation has not been validated in all clinical situations. eGFR's persistently <60 mL/min signify possible Chronic Kidney Disease.    Anion gap 8 5 -  15  CBC     Status: Abnormal   Collection Time: 01/28/16  5:10 AM  Result Value Ref Range   WBC 21.5 (H) 3.8 - 10.6 K/uL   RBC 4.47 4.40 - 5.90 MIL/uL   Hemoglobin 13.7 13.0 - 18.0 g/dL   HCT 40.7 40.0 - 52.0 %   MCV 91.1 80.0 - 100.0 fL  MCH 30.7 26.0 - 34.0 pg   MCHC 33.7 32.0 - 36.0 g/dL   RDW 13.0 11.5 - 14.5 %   Platelets 311 150 - 440 K/uL  MRSA PCR Screening     Status: Abnormal   Collection Time: 01/28/16  9:24 AM  Result Value Ref Range   MRSA by PCR POSITIVE (A) NEGATIVE    Comment:        The GeneXpert MRSA Assay (FDA approved for NASAL specimens only), is one component of a comprehensive MRSA colonization surveillance program. It is not intended to diagnose MRSA infection nor to guide or monitor treatment for MRSA infections. RESULT CALLED TO, READ BACK BY AND VERIFIED WITH: ABBEY GOODMAN ON 01/28/16 AT 1036 QSD   Procalcitonin - Baseline     Status: None   Collection Time: 01/28/16  3:16 PM  Result Value Ref Range   Procalcitonin <0.10 ng/mL    Comment:        Interpretation: PCT (Procalcitonin) <= 0.5 ng/mL: Systemic infection (sepsis) is not likely. Local bacterial infection is possible. (NOTE)         ICU PCT Algorithm               Non ICU PCT Algorithm    ----------------------------     ------------------------------         PCT < 0.25 ng/mL                 PCT < 0.1 ng/mL     Stopping of antibiotics            Stopping of antibiotics       strongly encouraged.               strongly encouraged.    ----------------------------     ------------------------------       PCT level decrease by               PCT < 0.25 ng/mL       >= 80% from peak PCT       OR PCT 0.25 - 0.5 ng/mL          Stopping of antibiotics                                             encouraged.     Stopping of antibiotics           encouraged.    ----------------------------     ------------------------------       PCT level decrease by              PCT >= 0.25 ng/mL       <  80% from peak PCT        AND PCT >= 0.5 ng/mL            Continuin g antibiotics                                              encouraged.       Continuing antibiotics            encouraged.    ----------------------------     ------------------------------     PCT level increase compared          PCT >  0.5 ng/mL         with peak PCT AND          PCT >= 0.5 ng/mL             Escalation of antibiotics                                          strongly encouraged.      Escalation of antibiotics        strongly encouraged.   Troponin I     Status: None   Collection Time: 01/28/16  3:16 PM  Result Value Ref Range   Troponin I <0.03 <0.03 ng/mL  Troponin I     Status: None   Collection Time: 01/28/16  7:09 PM  Result Value Ref Range   Troponin I <0.03 <0.03 ng/mL  Troponin I     Status: None   Collection Time: 01/28/16 11:20 PM  Result Value Ref Range   Troponin I <0.03 <0.03 ng/mL  Brain natriuretic peptide     Status: None   Collection Time: 01/29/16  6:30 AM  Result Value Ref Range   B Natriuretic Peptide 70.0 0.0 - 100.0 pg/mL  CBC     Status: Abnormal   Collection Time: 01/29/16  6:30 AM  Result Value Ref Range   WBC 27.9 (H) 3.8 - 10.6 K/uL   RBC 4.04 (L) 4.40 - 5.90 MIL/uL   Hemoglobin 12.2 (L) 13.0 - 18.0 g/dL   HCT 36.4 (L) 40.0 - 52.0 %   MCV 90.2 80.0 - 100.0 fL   MCH 30.3 26.0 - 34.0 pg   MCHC 33.6 32.0 - 36.0 g/dL   RDW 13.1 11.5 - 14.5 %   Platelets 305 150 - 440 K/uL  Rapid HIV screen (HIV 1/2 Ab+Ag)     Status: None   Collection Time: 01/29/16  6:30 AM  Result Value Ref Range   HIV-1 P24 Antigen - HIV24 NON REACTIVE NON REACTIVE   HIV 1/2 Antibodies NON REACTIVE NON REACTIVE   Interpretation (HIV Ag Ab)      A non reactive test result means that HIV 1 or HIV 2 antibodies and HIV 1 p24 antigen were not detected in the specimen.   No components found for: ESR, C REACTIVE PROTEIN MICRO: Recent Results (from the past 720 hour(s))  Culture, blood (routine x  2)     Status: None (Preliminary result)   Collection Time: 01/27/16  4:45 PM  Result Value Ref Range Status   Specimen Description BLOOD RIGHT ANTECUBITAL  Final   Special Requests   Final    BOTTLES DRAWN AEROBIC AND ANAEROBIC  AER 8CC ANA 11CC   Culture NO GROWTH 2 DAYS  Final   Report Status PENDING  Incomplete  Culture, blood (routine x 2)     Status: None (Preliminary result)   Collection Time: 01/27/16  4:45 PM  Result Value Ref Range Status   Specimen Description BLOOD LEFT FOREARM  Final   Special Requests   Final    BOTTLES DRAWN AEROBIC AND ANAEROBIC  AER 10CC ANA 12CC   Culture NO GROWTH 2 DAYS  Final   Report Status PENDING  Incomplete  MRSA PCR Screening     Status: Abnormal   Collection Time: 01/28/16  9:24 AM  Result Value Ref Range Status   MRSA by PCR POSITIVE (A) NEGATIVE Final  Comment:        The GeneXpert MRSA Assay (FDA approved for NASAL specimens only), is one component of a comprehensive MRSA colonization surveillance program. It is not intended to diagnose MRSA infection nor to guide or monitor treatment for MRSA infections. RESULT CALLED TO, READ BACK BY AND VERIFIED WITH: ABBEY GOODMAN ON 01/28/16 AT 1036 QSD     IMAGING: X-ray Chest Pa And Lateral  Result Date: 01/28/2016 CLINICAL DATA:  Admitted yesterday with pneumonia. History of breast cancer. EXAM: CHEST  2 VIEW COMPARISON:  01/27/2016 and chest CT 01/27/2016 FINDINGS: Lungs are adequately inflated with mild patchy opacification in the lung bases and over the anterior upper lungs on the lateral film worse compared to the previous chest x-ray and as seen on the recent chest CT as findings are compatible with pneumonia. No significant effusion. Cardiomediastinal silhouette and remainder of the exam is unchanged. IMPRESSION: Mild multifocal airspace process as seen on recent CT compatible with pneumonia. Electronically Signed   By: Marin Olp M.D.   On: 01/28/2016 08:10   Dg Chest 2  View  Result Date: 01/27/2016 CLINICAL DATA:  Diagnosed with pneumonia 2 weeks ago.  Cough. EXAM: CHEST  2 VIEW COMPARISON:  Multiple chest x-rays since July 31, 2014 FINDINGS: The heart, hila, and mediastinum are normal. No pulmonary nodules or masses. No focal infiltrates. IMPRESSION: No active cardiopulmonary disease. Electronically Signed   By: Dorise Bullion III M.D   On: 01/27/2016 12:06   Dg Chest 2 View  Result Date: 01/16/2016 CLINICAL DATA:  Productive cough and shortness of breath for 2 months. History of pneumonia and chest tumor. EXAM: CHEST  2 VIEW COMPARISON:  Chest radiograph June 11, 2015 FINDINGS: Cardiomediastinal silhouette is normal. No pleural effusions or focal consolidations. Resolution of RIGHT lung base pneumonia. Trachea projects midline and there is no pneumothorax. Soft tissue planes and included osseous structures are non-suspicious. IMPRESSION: Normal chest radiograph. Electronically Signed   By: Elon Alas M.D.   On: 01/16/2016 16:50   Ct Angio Chest Pe W/cm &/or Wo Cm  Result Date: 01/27/2016 CLINICAL DATA:  Patient was diagnosed with pneumonia 2 weeks ago. Patient finished antibiotics. Patient states that his cough got better for a few days but then symptoms started again. Patient c/o pain with cough, shortness of breath. Productive cough. Rib pain. EXAM: CT ANGIOGRAPHY CHEST WITH CONTRAST TECHNIQUE: Multidetector CT imaging of the chest was performed using the standard protocol during bolus administration of intravenous contrast. Multiplanar CT image reconstructions and MIPs were obtained to evaluate the vascular anatomy. CONTRAST:  75 cc Isovue 370 COMPARISON:  Chest x-ray 01/27/2016 FINDINGS: Cardiovascular: Pulmonary arteries are well opacified. There is no acute pulmonary embolus. Heart size is normal. No imaged pericardial effusion or significant coronary artery calcifications. Aortic arch is normally opacified. Mediastinum/Nodes: The visualized portion of  the thyroid gland has a normal appearance. No mediastinal, hilar, or axillary adenopathy. Esophagus is normal. Lungs/Pleura: Nodular, patchy infiltrates are identified within the lower lobes bilaterally, left greater than right and consistent with infectious infiltrates. Perihilar peribronchial thickening also noted in the lower lobes. Large airways are patent. Upper Abdomen: No acute abnormality. Musculoskeletal: No chest wall abnormality. No acute or significant osseous findings. Review of the MIP images confirms the above findings. IMPRESSION: 1. Significant bilateral lower lobe infiltrates consistent with infectious process. 2. Technically adequate exam showing no acute pulmonary embolus. Electronically Signed   By: Nolon Nations M.D.   On: 01/27/2016 15:47   Ct Abdomen Pelvis  W Contrast  Result Date: 01/16/2016 CLINICAL DATA:  Pneumonia and chronic cough. EXAM: CT ABDOMEN AND PELVIS WITH CONTRAST TECHNIQUE: Multidetector CT imaging of the abdomen and pelvis was performed using the standard protocol following bolus administration of intravenous contrast. CONTRAST:  145m ISOVUE-300 IOPAMIDOL (ISOVUE-300) INJECTION 61% COMPARISON:  None. FINDINGS: Lower chest: There is patchy consolidation at the posterior basal segment of the left lower lobe. Hepatobiliary: Liver and gallbladder are within normal limits. Pancreas: Unremarkable Spleen: Unremarkable Adrenals/Urinary Tract: Adrenal glands, kidneys, and bladder are within normal limits. Stomach/Bowel: Unremarkable appearance of the stomach. Mildly prominent loops of small and large bowel in a mild ileus pattern. The appendix is retrocecal. The base of the appendix is opacified with contrast. The tip of the appendix is non-opacified. It is somewhat ill-defined with adjacent inflammatory change. There is stranding in the right pericolic fat. There is also noted to be wall thickening of the adjacent ascending colon. There are inflammatory changes in the  adjacent fat. Vascular/Lymphatic: No aortic aneurysm. No retroperitoneal adenopathy. Reproductive: Prostate is unremarkable. Other: No free-fluid. Musculoskeletal: No vertebral compression deformity. IMPRESSION: There is an inflammatory process involving the ascending colon and possibly the tip of the appendix. Acute appendicitis cannot be excluded. Alternatively, the appendix may be secondarily inflamed by colitis. Correlate clinically as for further management. Critical Value/emergent results were called by telephone at the time of interpretation on 01/16/2016 at 7:26 pm to Dr. CRollene Fare who verbally acknowledged these results. Left lower lobe pneumonia. Electronically Signed   By: AMarybelle KillingsM.D.   On: 01/16/2016 19:28    Assessment:   CPRINCETON NABORis a 30y.o. male admitted with fevers, leukocytosis and CT scan showing multifocal PNA. He has a hx recurrent Pnas in the past and has had multiple imaging studies over the last 2 years showing some RLL infiltrates (09/15/13, 06/11/15, 1128/17).  He is a heavy smoker, and I am concerned for risk of aspiration given episode of "waking up choking in the middle of the night" His mother has scleroderma and he is concerned he may have this as well.  He otherwise denies dysphagia.  Early MAC can present like this.   Recommendations Would dc on a 14 day course of levofloxacin I can see in 2 weeks time to assess repeat imaging and follow up to see if needs referral to GI for endoscopy to eval for esophageal issues.  I strongly advised him to stop smoking as this will help heal his lungs and he has agreed.   Thank you very much for allowing me to participate in the care of this patient. Please call with questions.   Trevor Zimmerman

## 2016-01-29 NOTE — Progress Notes (Signed)
Discharge instructions reviewed with the pt.  rx for all meds given to the patient

## 2016-02-01 LAB — CULTURE, BLOOD (ROUTINE X 2)
CULTURE: NO GROWTH
Culture: NO GROWTH

## 2017-02-14 ENCOUNTER — Encounter: Payer: Self-pay | Admitting: Emergency Medicine

## 2017-02-14 ENCOUNTER — Emergency Department
Admission: EM | Admit: 2017-02-14 | Discharge: 2017-02-14 | Disposition: A | Discharge status: Home / Self Care | Payer: Medicaid Other | Attending: Emergency Medicine | Admitting: Emergency Medicine

## 2017-02-14 ENCOUNTER — Other Ambulatory Visit: Payer: Self-pay

## 2017-02-14 ENCOUNTER — Emergency Department: Payer: Medicaid Other

## 2017-02-14 DIAGNOSIS — Z79899 Other long term (current) drug therapy: Secondary | ICD-10-CM | POA: Insufficient documentation

## 2017-02-14 DIAGNOSIS — F17210 Nicotine dependence, cigarettes, uncomplicated: Secondary | ICD-10-CM | POA: Insufficient documentation

## 2017-02-14 DIAGNOSIS — J449 Chronic obstructive pulmonary disease, unspecified: Secondary | ICD-10-CM | POA: Insufficient documentation

## 2017-02-14 DIAGNOSIS — M25462 Effusion, left knee: Secondary | ICD-10-CM | POA: Insufficient documentation

## 2017-02-14 DIAGNOSIS — M25562 Pain in left knee: Secondary | ICD-10-CM | POA: Diagnosis present

## 2017-02-14 MED ORDER — OXYCODONE-ACETAMINOPHEN 5-325 MG PO TABS
1.0000 | ORAL_TABLET | Freq: Once | ORAL | Status: AC
  Administered 2017-02-14: 1 via ORAL
  Filled 2017-02-14: qty 1

## 2017-02-14 MED ORDER — OXYCODONE-ACETAMINOPHEN 5-325 MG PO TABS
1.0000 | ORAL_TABLET | Freq: Four times a day (QID) | ORAL | 0 refills | Status: AC | PRN
Start: 2017-02-14 — End: 2017-02-17

## 2017-02-14 MED ORDER — IBUPROFEN 800 MG PO TABS: 800.0000 mg | ORAL_TABLET | Freq: Three times a day (TID) | ORAL | 0 refills | Status: DC | PRN

## 2017-02-14 NOTE — ED Triage Notes (Signed)
Pt to ed with c/o left knee pain that started 2 days ago.  Pt states he was "playing with his daughter" and felt a pop.  Pt states worse pain yesterday.

## 2017-02-14 NOTE — ED Provider Notes (Signed)
[facility] Emergency Department Provider Note  ____________________________________________  Time seen: Approximately 2:56 PM  I have reviewed the triage vital signs and the nursing notes.   HISTORY  Chief Complaint Knee Pain    HPI Trevor Zimmerman is a 31 y.o. male that presents to the emergency department for evaluation of left knee pain after injury 2 days ago.  Patient was playing with his daughter when he felt a pop.  This caused him to fall to the ground. He has been getting episodes of occasional numbness over the front of his knee since incident.  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  He took some Goody powders for pain.  No additional injuries or concerns.  Past Medical History:  Diagnosis Date  . Cancer [facility])     Patient Active Problem List   Diagnosis Date Noted  . Leukocytosis 01/29/2016  . Hyponatremia 01/29/2016  . Tobacco abuse counseling 01/29/2016  . Fluid overload 01/29/2016  . COPD exacerbation ([place]) 01/29/2016  . Multifocal pneumonia 01/27/2016  . Persistent cough 01/27/2016  . Pleuritic chest pain 01/27/2016  . SOB (shortness of breath) 01/27/2016  . Community acquired pneumonia of left lower lobe of lung ([place])   . Generalized abdominal pain     Past Surgical History:  Procedure Laterality Date  . 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 Authorizing Provider  albuterol (PROVENTIL HFA;VENTOLIN HFA) 108 (90 Base) MCG/ACT inhaler Inhale 2 puffs into the lungs every 6 (six) hours as needed for wheezing or shortness of breath. 01/27/16   Lisa Roca, MD  chlorpheniramine-HYDROcodone (TUSSIONEX) 10-8 MG/5ML SUER Take 5 mLs by mouth every 12 (twelve) hours as needed for cough. 01/29/16   Theodoro Grist, MD  guaiFENesin (MUCINEX) 600 MG 12 hr tablet Take 2 tablets (1,200 mg total) by mouth 2 (two) times daily.  01/29/16   Theodoro Grist, MD  ibuprofen (ADVIL,MOTRIN) 800 MG tablet Take 1 tablet (800 mg total) by mouth every 8 (eight) hours as needed. 02/14/17   Laban Emperor, PA-C  levofloxacin (LEVAQUIN) 750 MG tablet Take 1 tablet (750 mg total) by mouth daily. 01/30/16   Theodoro Grist, MD  mupirocin ointment (BACTROBAN) 2 % Place 1 application into the nose 2 (two) times daily. 01/29/16   Theodoro Grist, MD  nicotine (NICODERM CQ - DOSED IN MG/24 HOURS) 21 mg/24hr patch Place 1 patch (21 mg total) onto the skin daily. 01/30/16   Theodoro Grist, MD  oxyCODONE-acetaminophen (ROXICET) 5-325 MG tablet Take 1 tablet by mouth every 6 (six) hours as needed for up to 3 days. 02/14/17 02/17/17  Laban Emperor, PA-C  predniSONE (STERAPRED UNI-PAK 21 TAB) 10 MG (21) TBPK tablet Take 1 tablet (10 mg total) by mouth daily. Please take 6 pills in the morning on the day 1, then taper by one pill daily until finished, thank you 01/29/16   Theodoro Grist, MD    Allergies Patient has no known allergies.  History reviewed. No pertinent family history.  Social History Social History   Tobacco Use  . Smoking status: Current Every Day Smoker    Packs/day: 0.25    Types: Cigarettes  . Smokeless tobacco: Never Used  Substance Use Topics  . Alcohol use: No  . Drug use: No     Review of Systems  Cardiovascular: No chest pain. Respiratory: No SOB. Gastrointestinal: No abdominal pain.  Musculoskeletal: Positive for knee pain. Skin: Negative for rash, abrasions, lacerations, ecchymosis. Neurological: Negative for headaches   ____________________________________________   PHYSICAL EXAM:  VITAL SIGNS: ED Triage Vitals  Enc Vitals Group     BP 02/14/17 1334 127/83     Pulse Rate 02/14/17 1334 88     Resp 02/14/17 1334 16     Temp 02/14/17 1334 98.3 F (36.8 C)     Temp Source 02/14/17 1334 Oral     SpO2 02/14/17 1334 99 %     Weight 02/14/17 1314 130 lb (59 kg)     Height --      Head  Circumference --      Peak Flow --      Pain Score 02/14/17 1314 9     Pain Loc --      Pain Edu? --      Excl. in [place]? --      Constitutional: Alert and oriented. Well appearing and in no acute distress. Eyes: Conjunctivae are normal. PERRL. EOMI. Head: Atraumatic. ENT:      Ears:      Nose: No congestion/rhinnorhea.      Mouth/Throat: Mucous membranes are moist.  Neck: No stridor.  Cardiovascular: Normal rate, regular rhythm.  Good peripheral circulation. Respiratory: Normal respiratory effort without tachypnea or retractions. Lungs CTAB. Good air entry to the bases with no decreased or absent breath sounds. Musculoskeletal: Full range of motion to all extremities. No gross deformities appreciated.  Tenderness to palpation over medial joint line.  Negative anterior drawer, posterior drawer, valgus, varus, mcMurray, patella apprehension. Positive apley grind. Neurologic:  Normal speech and language. No gross focal neurologic deficits are appreciated.  Skin:  Skin is warm, dry and intact. No rash noted.   ____________________________________________   LABS (all labs ordered are listed, but only abnormal results are displayed)  Labs Reviewed - No data to display ____________________________________________  EKG   ____________________________________________  RADIOLOGY Robinette Haines, personally viewed and evaluated these images (plain radiographs) as part of my medical decision making, as well as reviewing the written report by the radiologist.  Dg Knee Complete 4 Views Left  Result Date: 02/14/2017 CLINICAL DATA:  Pain and swelling after twisting injury EXAM: LEFT KNEE - 3 VIEW COMPARISON:  None. FINDINGS: Frontal, oblique, and lateral views obtained. There is no appreciable fracture or dislocation. There is a rather minimal joint effusion. Joint spaces appear normal. No erosive change. IMPRESSION: Rather minimal joint effusion. No fracture or dislocation. No appreciable  arthropathy. Electronically Signed   By: Lowella Grip III M.D.   On: 02/14/2017 14:55    ____________________________________________    PROCEDURES  Procedure(s) performed:    Procedures    Medications  oxyCODONE-acetaminophen (PERCOCET/ROXICET) 5-325 MG per tablet 1 tablet (1 tablet Oral Given 02/14/17 1402)     ____________________________________________   INITIAL IMPRESSION / ASSESSMENT AND PLAN / ED COURSE  Pertinent labs & imaging results that were available during my care of the patient were reviewed by me and considered in my medical decision making (see chart for details).  Review of the [place] CSRS was performed in accordance of the [place] prior to dispensing any controlled drugs.   Patient presents emergency department for evaluation of knee injury.  Vital signs and exam are reassuring.  X-ray consistent with minimal joint effusion.  Knee brace and crutches were given. Patient will use crutches and wear brace until followup with PCP. Patient will be discharged home with prescriptions for ibuprofen and a short  course of Percocet. Patient is to follow up with PCP as directed. Patient is given ED precautions to return to the ED for any worsening or new symptoms.   ____________________________________________  FINAL CLINICAL IMPRESSION(S) / ED DIAGNOSES  Final diagnoses:  Effusion of left knee      NEW MEDICATIONS STARTED DURING THIS VISIT:  ED Discharge Orders        Ordered    oxyCODONE-acetaminophen (ROXICET) 5-325 MG tablet  Every 6 hours PRN     02/14/17 1513    ibuprofen (ADVIL,MOTRIN) 800 MG tablet  Every 8 hours PRN     02/14/17 1513          This chart was dictated using voice recognition software/Dragon. Despite best efforts to proofread, errors can occur which can change the meaning. Any change was purely unintentional.    Laban Emperor, PA-C 02/14/17 1600    Nena Polio, MD 02/16/17 1159

## 2017-02-14 NOTE — ED Notes (Signed)
See triage note  Presents with pain to left knee  States he was playing around with his children couple of days ago    Felt knee pop  States he thought he dislocated his kneecap   States pain has gotten worse since  Unable to bear full wt  Ambulates with a limp

## 2017-07-21 ENCOUNTER — Emergency Department: Payer: Medicaid Other

## 2017-07-21 ENCOUNTER — Emergency Department
Admission: EM | Admit: 2017-07-21 | Discharge: 2017-07-21 | Disposition: A | Discharge status: Home / Self Care | Payer: Medicaid Other | Attending: Emergency Medicine | Admitting: Emergency Medicine

## 2017-07-21 ENCOUNTER — Other Ambulatory Visit: Payer: Self-pay

## 2017-07-21 ENCOUNTER — Encounter: Payer: Self-pay | Admitting: Emergency Medicine

## 2017-07-21 DIAGNOSIS — F17210 Nicotine dependence, cigarettes, uncomplicated: Secondary | ICD-10-CM | POA: Insufficient documentation

## 2017-07-21 DIAGNOSIS — Y9389 Activity, other specified: Secondary | ICD-10-CM | POA: Diagnosis not present

## 2017-07-21 DIAGNOSIS — X500XXA Overexertion from strenuous movement or load, initial encounter: Secondary | ICD-10-CM | POA: Diagnosis not present

## 2017-07-21 DIAGNOSIS — Y999 Unspecified external cause status: Secondary | ICD-10-CM | POA: Diagnosis not present

## 2017-07-21 DIAGNOSIS — Y929 Unspecified place or not applicable: Secondary | ICD-10-CM | POA: Insufficient documentation

## 2017-07-21 DIAGNOSIS — S63501A Unspecified sprain of right wrist, initial encounter: Secondary | ICD-10-CM | POA: Diagnosis not present

## 2017-07-21 DIAGNOSIS — J449 Chronic obstructive pulmonary disease, unspecified: Secondary | ICD-10-CM | POA: Insufficient documentation

## 2017-07-21 DIAGNOSIS — S6991XA Unspecified injury of right wrist, hand and finger(s), initial encounter: Secondary | ICD-10-CM | POA: Diagnosis present

## 2017-07-21 MED ORDER — MELOXICAM 15 MG PO TABS: 15.0000 mg | ORAL_TABLET | Freq: Every day | ORAL | 0 refills | Status: DC

## 2017-07-21 NOTE — ED Provider Notes (Signed)
[facility] Emergency Department Provider Note  ____________________________________________  Time seen: Approximately 7:59 PM  I have reviewed the triage vital signs and the nursing notes.   HISTORY  Chief Complaint Wrist Pain    HPI Trevor Zimmerman is a 32 y.o. male who presents the emergency department complaining of right wrist pain.  Patient reports that 2 days ago he was walking his dog when he sustained an injury.  Patient was helping to support his son who was on a bicycle with his left hand and wrapped the leash around his right wrist.  The patient's dog, fairly large dog bite report, saw a cat and lunged forward.  Patient reports that this caused a hyperextension of his wrist.  Patient reports that he did not fall or land on the wrist.  He complains of pain, swelling over the distal ulna and radius area.  Patient does have extension and flexion of the wrist and denies any numbness and tingling in the digits.  Patient is concerned for fracture versus dislocation as he has had significant ongoing pain since injury.  No other injury or complaint.  Patient has no complaints with chronic medical problems at this time.  Past Medical History:  Diagnosis Date  . Cancer [facility])     Patient Active Problem List   Diagnosis Date Noted  . Leukocytosis 01/29/2016  . Hyponatremia 01/29/2016  . Tobacco abuse counseling 01/29/2016  . Fluid overload 01/29/2016  . COPD exacerbation ([place]) 01/29/2016  . Multifocal pneumonia 01/27/2016  . Persistent cough 01/27/2016  . Pleuritic chest pain 01/27/2016  . SOB (shortness of breath) 01/27/2016  . Community acquired pneumonia of left lower lobe of lung ([place])   . Generalized abdominal pain     Past Surgical History:  Procedure Laterality Date  . 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 Authorizing Provider  albuterol (PROVENTIL HFA;VENTOLIN  HFA) 108 (90 Base) MCG/ACT inhaler Inhale 2 puffs into the lungs every 6 (six) hours as needed for wheezing or shortness of breath. 01/27/16   Lisa Roca, MD  chlorpheniramine-HYDROcodone (TUSSIONEX) 10-8 MG/5ML SUER Take 5 mLs by mouth every 12 (twelve) hours as needed for cough. 01/29/16   Theodoro Grist, MD  guaiFENesin (MUCINEX) 600 MG 12 hr tablet Take 2 tablets (1,200 mg total) by mouth 2 (two) times daily. 01/29/16   Theodoro Grist, MD  ibuprofen (ADVIL,MOTRIN) 800 MG tablet Take 1 tablet (800 mg total) by mouth every 8 (eight) hours as needed. 02/14/17   Laban Emperor, PA-C  levofloxacin (LEVAQUIN) 750 MG tablet Take 1 tablet (750 mg total) by mouth daily. 01/30/16   Theodoro Grist, MD  meloxicam (MOBIC) 15 MG tablet Take 1 tablet (15 mg total) by mouth daily. 07/21/17   Elim Peale, Charline Bills, PA-C  mupirocin ointment (BACTROBAN) 2 % Place 1 application into the nose 2 (two) times daily. 01/29/16   Theodoro Grist, MD  nicotine (NICODERM CQ - DOSED IN MG/24 HOURS) 21 mg/24hr patch Place 1 patch (21 mg total) onto the skin daily. 01/30/16   Theodoro Grist, MD  predniSONE (STERAPRED UNI-PAK 21 TAB) 10 MG (21) TBPK tablet Take 1 tablet (10 mg total) by mouth daily. Please take 6 pills in the morning on the day 1, then taper by one pill daily until finished, thank you 01/29/16   Theodoro Grist, MD    Allergies Patient has no known allergies.  No family history on  file.  Social History Social History   Tobacco Use  . Smoking status: Current Every Day Smoker    Packs/day: 0.25    Types: Cigarettes  . Smokeless tobacco: Never Used  Substance Use Topics  . Alcohol use: No  . Drug use: No     Review of Systems  Constitutional: No fever/chills Eyes: No visual changes.  Cardiovascular: no chest pain. Respiratory: no cough. No SOB. Gastrointestinal: No abdominal pain.  No nausea, no vomiting.   Musculoskeletal: Positive for right wrist injury/pain Skin: Negative for rash, abrasions,  lacerations, ecchymosis. Neurological: Negative for headaches, focal weakness or numbness. 10-point ROS otherwise negative.  ____________________________________________   PHYSICAL EXAM:  VITAL SIGNS: ED Triage Vitals  Enc Vitals Group     BP 07/21/17 1949 135/72     Pulse Rate 07/21/17 1949 82     Resp 07/21/17 1949 20     Temp 07/21/17 1949 98 F (36.7 C)     Temp src --      SpO2 07/21/17 1949 100 %     Weight 07/21/17 1948 127 lb (57.6 kg)     Height 07/21/17 1948 5\' 5"  (1.610 m)     Head Circumference --      Peak Flow --      Pain Score 07/21/17 1948 8     Pain Loc --      Pain Edu? --      Excl. in [place]? --      Constitutional: Alert and oriented. Well appearing and in no acute distress. Eyes: Conjunctivae are normal. PERRL. EOMI. Head: Atraumatic. Neck: No stridor.    Cardiovascular: Normal rate, regular rhythm. Normal S1 and S2.  Good peripheral circulation. Respiratory: Normal respiratory effort without tachypnea or retractions. Lungs CTAB. Good air entry to the bases with no decreased or absent breath sounds. Musculoskeletal: Full range of motion to all extremities. No gross deformities appreciated.  No deformity, ecchymosis noted to the right wrist.  Mild edema is appreciated.  Patient is very tender to palpation of the distal ulna radius and proximal carpal line.  No palpable abnormality or crepitus.  Patient is able to extend, flex, pronate and supinate the wrist.  Full range of motion all 5 digits.  Sensation intact all 5 digits.  Capillary refill less than 2 seconds all digits. Neurologic:  Normal speech and language. No gross focal neurologic deficits are appreciated.  Skin:  Skin is warm, dry and intact. No rash noted. Psychiatric: Mood and affect are normal. Speech and behavior are normal. Patient exhibits appropriate insight and judgement.   ____________________________________________   LABS (all labs ordered are listed, but only abnormal results are  displayed)  Labs Reviewed - No data to display ____________________________________________  EKG   ____________________________________________  RADIOLOGY Diamantina Providence Waneta Fitting, personally viewed and evaluated these images (plain radiographs) as part of my medical decision making, as well as reviewing the written report by the radiologist.  I concur with radiologist finding of no acute osseous abnormality to the right wrist.  Dg Wrist Complete Right  Result Date: 07/21/2017 CLINICAL DATA:  Right wrist injury. EXAM: RIGHT WRIST - COMPLETE 3+ VIEW COMPARISON:  None. FINDINGS: There is no evidence of fracture or dislocation. There is no evidence of arthropathy or other focal bone abnormality. Soft tissues are unremarkable. IMPRESSION: Negative. Electronically Signed   By: Titus Dubin M.D.   On: 07/21/2017 20:13    ____________________________________________    PROCEDURES  Procedure(s) performed:    Procedures  Medications - No data to display   ____________________________________________   INITIAL IMPRESSION / ASSESSMENT AND PLAN / ED COURSE  Pertinent labs & imaging results that were available during my care of the patient were reviewed by me and considered in my medical decision making (see chart for details).  Review of the [place] CSRS was performed in accordance of the [place] prior to dispensing any controlled drugs.     Patient's diagnosis is consistent with right wrist sprain.  Patient presents the emergency department with pain, swelling to the right wrist.  Differential includes dislocation, fracture, sprain.  X-ray reveals no acute osseous ab normality.  Exam is otherwise reassuring.  Patient is given Velcro wrist brace for symptom improvement.. Patient will be discharged home with prescriptions for oxygen for symptom improvement. Patient is to follow up with primary care or orthopedics as needed or otherwise directed. Patient is given ED precautions to return  to the ED for any worsening or new symptoms.     ____________________________________________  FINAL CLINICAL IMPRESSION(S) / ED DIAGNOSES  Final diagnoses:  Sprain of right wrist, initial encounter      NEW MEDICATIONS STARTED DURING THIS VISIT:  ED Discharge Orders        Ordered    meloxicam (MOBIC) 15 MG tablet  Daily     07/21/17 2021          This chart was dictated using voice recognition software/Dragon. Despite best efforts to proofread, errors can occur which can change the meaning. Any change was purely unintentional.    Darletta Moll, PA-C 07/21/17 2032    Schuyler Amor, MD 07/21/17 2252

## 2017-07-21 NOTE — ED Triage Notes (Signed)
Patient ambulatory to triage with steady gait, without difficulty or distress noted; pt reports injuring right wrist Saturday while walking dog

## 2018-05-31 ENCOUNTER — Encounter: Payer: Self-pay | Admitting: Emergency Medicine

## 2018-05-31 ENCOUNTER — Emergency Department
Admission: EM | Admit: 2018-05-31 | Discharge: 2018-05-31 | Disposition: A | Discharge status: Home / Self Care | Payer: Medicaid Other | Attending: Emergency Medicine | Admitting: Emergency Medicine

## 2018-05-31 ENCOUNTER — Other Ambulatory Visit: Payer: Self-pay

## 2018-05-31 DIAGNOSIS — M5442 Lumbago with sciatica, left side: Secondary | ICD-10-CM | POA: Insufficient documentation

## 2018-05-31 DIAGNOSIS — R202 Paresthesia of skin: Secondary | ICD-10-CM | POA: Diagnosis present

## 2018-05-31 DIAGNOSIS — F17210 Nicotine dependence, cigarettes, uncomplicated: Secondary | ICD-10-CM | POA: Diagnosis not present

## 2018-05-31 MED ORDER — CYCLOBENZAPRINE HCL 5 MG PO TABS: 5.0000 mg | ORAL_TABLET | Freq: Three times a day (TID) | ORAL | 0 refills | Status: DC | PRN

## 2018-05-31 MED ORDER — ORPHENADRINE CITRATE 30 MG/ML IJ SOLN
60.0000 mg | Freq: Two times a day (BID) | INTRAMUSCULAR | Status: DC
  Administered 2018-05-31: 14:00:00 60 mg via INTRAMUSCULAR
  Filled 2018-05-31: qty 2

## 2018-05-31 MED ORDER — HYDROCODONE-ACETAMINOPHEN 5-325 MG PO TABS
1.0000 | ORAL_TABLET | Freq: Once | ORAL | Status: AC
Start: 2018-05-31 — End: 2018-05-31
  Administered 2018-05-31: 14:00:00 1 via ORAL
  Filled 2018-05-31: qty 1

## 2018-05-31 MED ORDER — HYDROCODONE-ACETAMINOPHEN 5-325 MG PO TABS: 1.0000 | ORAL_TABLET | Freq: Four times a day (QID) | ORAL | 0 refills | Status: DC | PRN

## 2018-05-31 NOTE — ED Notes (Signed)

## 2018-05-31 NOTE — ED Triage Notes (Signed)
Pt c/o shooting L leg pain. Pt hx of sciatica. VSS, ambulatory

## 2018-05-31 NOTE — ED Provider Notes (Signed)
[facility] Emergency Department Provider Note  ____________________________________________   First MD Initiated Contact with Patient 05/31/18 1340     (approximate)  I have reviewed the triage vital signs and the nursing notes.   HISTORY  Chief Complaint Leg Pain   HPI Trevor Zimmerman is a 33 y.o. male presents to the ED with complaint of left leg paresthesias.  Patient states that he has been seen in the past for sciatica and recently saw his wife's doctor who placed him on prednisone.  Currently he is on his fourth day of prednisone and states that the radiation from his left hip is down to his mid calf.  He also reports that at times his toes feel "tingly".  He denies any injury to his back or leg.  He continues to ambulate without any assistance.  He denies any saddle anesthesias or incontinence of bowel or bladder.  Currently rates pain as an 8 out of 10.      Past Medical History:  Diagnosis Date  . Cancer [facility])     Patient Active Problem List   Diagnosis Date Noted  . Leukocytosis 01/29/2016  . Hyponatremia 01/29/2016  . Tobacco abuse counseling 01/29/2016  . Fluid overload 01/29/2016  . COPD exacerbation ([place]) 01/29/2016  . Multifocal pneumonia 01/27/2016  . Persistent cough 01/27/2016  . Pleuritic chest pain 01/27/2016  . SOB (shortness of breath) 01/27/2016  . Community acquired pneumonia of left lower lobe of lung ([place])   . Generalized abdominal pain     Past Surgical History:  Procedure Laterality Date  . 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 Authorizing Provider  albuterol (PROVENTIL HFA;VENTOLIN HFA) 108 (90 Base) MCG/ACT inhaler Inhale 2 puffs into the lungs every 6 (six) hours as needed for wheezing or shortness of breath. 01/27/16   Lisa Roca, MD  cyclobenzaprine (FLEXERIL) 5 MG tablet Take 1 tablet (5 mg total) by mouth 3 (three) times daily  as needed for muscle spasms. 05/31/18   Johnn Hai, PA-C  HYDROcodone-acetaminophen (NORCO/VICODIN) 5-325 MG tablet Take 1 tablet by mouth every 6 (six) hours as needed for moderate pain. 05/31/18   Johnn Hai, PA-C  ibuprofen (ADVIL,MOTRIN) 800 MG tablet Take 1 tablet (800 mg total) by mouth every 8 (eight) hours as needed. 02/14/17   Laban Emperor, PA-C  mupirocin ointment (BACTROBAN) 2 % Place 1 application into the nose 2 (two) times daily. 01/29/16   Theodoro Grist, MD  nicotine (NICODERM CQ - DOSED IN MG/24 HOURS) 21 mg/24hr patch Place 1 patch (21 mg total) onto the skin daily. 01/30/16   Theodoro Grist, MD    Allergies Patient has no known allergies.  No family history on file.  Social History Social History   Tobacco Use  . Smoking status: Current Every Day Smoker    Packs/day: 0.25    Types: Cigarettes  . Smokeless tobacco: Never Used  Substance Use Topics  . Alcohol use: No  . Drug use: No    Review of Systems Constitutional: No fever/chills Cardiovascular: Denies chest pain. Respiratory: Denies shortness of breath. Gastrointestinal: No abdominal pain.  No nausea, no vomiting.  Genitourinary: Negative for dysuria. Musculoskeletal: Positive for low back pain with left leg radiculopathy. Skin: Negative for rash. Neurological: Negative for headaches, focal weakness or numbness. ___________________________________________   PHYSICAL EXAM:  VITAL SIGNS: ED Triage Vitals [05/31/18 1332]  Enc Vitals Group  BP 100/62     Pulse Rate 92     Resp 16     Temp 98.4 F (36.9 C)     Temp Source Oral     SpO2 100 %     Weight      Height      Head Circumference      Peak Flow      Pain Score 8     Pain Loc      Pain Edu?      Excl. in [place]?    Constitutional: Alert and oriented. Well appearing and in no acute distress. Eyes: Conjunctivae are normal.  Head: Atraumatic. Neck: No stridor.   Cardiovascular: Normal rate, regular rhythm. Grossly normal  heart sounds.  Good peripheral circulation. Respiratory: Normal respiratory effort.  No retractions. Lungs CTAB. Gastrointestinal: Soft and nontender. No distention. Musculoskeletal: On examination of the back there is no point tenderness on palpation of the thoracic or lumbar spine.  There is moderate tenderness on palpation of the left SI joint area and surrounding tissue.  Good muscle strength bilaterally.  Straight leg raises are negative.  Patient is able to stand and ambulate without any assistance.  There is a small limp noted secondary to his discomfort. Neurologic:  Normal speech and language. No gross focal neurologic deficits are appreciated.  Skin:  Skin is warm, dry and intact.  Psychiatric: Mood and affect are normal. Speech and behavior are normal.  ____________________________________________   LABS (all labs ordered are listed, but only abnormal results are displayed)  Labs Reviewed - No data to display   PROCEDURES  Procedure(s) performed (including Critical Care):  Procedures   ____________________________________________   INITIAL IMPRESSION / ASSESSMENT AND PLAN / ED COURSE  As part of my medical decision making, I reviewed the following data within the electronic MEDICAL RECORD NUMBER Notes from prior ED visits and [place] Controlled Substance Database  33 year old male presents to the ED with complaint of left leg sciatica.  Patient recently was seen by his wife's doctor and placed on prednisone.  He states that today it continues to hurt despite the prednisone and ibuprofen that he has been taking.  He denies any injury or previous symptoms of sciatica.  Physical exam is consistent with sciatica.  Patient was given Norco and an injection of Norflex while in the ED.  Patient is continue taking prednisone as directed by his doctor.  He also was given a prescription for Norco every 6 hours as needed for pain and Flexeril 5 mg 3 times daily.  He is aware that these 2  medications could cause drowsiness and increase his risk for injury.  He was given a note to remain out of work for the next 2 days.  If there is any continued problems or worsening of his symptoms he is to call his PCP or return to the emergency department.  ____________________________________________   FINAL CLINICAL IMPRESSION(S) / ED DIAGNOSES  Final diagnoses:  Acute left-sided low back pain with left-sided sciatica     ED Discharge Orders         Ordered    HYDROcodone-acetaminophen (NORCO/VICODIN) 5-325 MG tablet  Every 6 hours PRN     05/31/18 1410    cyclobenzaprine (FLEXERIL) 5 MG tablet  3 times daily PRN     05/31/18 1410           Note:  This document was prepared using Dragon voice recognition software and may include unintentional dictation errors.  Johnn Hai, PA-C 05/31/18 1532    Lavonia Drafts, MD 06/08/18 [phone]

## 2018-05-31 NOTE — Discharge Instructions (Addendum)
Follow-up with your wife's doctor or Dr. Truitt Leep who is the orthopedist on call.  His office set [facility] in the orthopedic department.  Continue taking the prednisone as directed.  Discontinue taking ibuprofen at this time.  The Flexeril that was sent to the pharmacy as a muscle relaxant and could cause drowsiness along with the Norco which is a narcotic.  Do not drive or operate machinery while taking these medications.  You may use ice or heat to your lower back as needed for discomfort.  Return to the emergency department if any severe worsening of your symptoms or loss of bowel or bladder control.  When you return to work you may only take the pain medication and muscle relaxant when you are at home.

## 2018-05-31 NOTE — ED Triage Notes (Signed)
Pt presents to ED via POV with c/o L lower back pain and leg pain. Pt states hx of sciatica at this time. Pt ambulatory with slight limp noted in triage.

## 2018-06-04 ENCOUNTER — Emergency Department
Admission: EM | Admit: 2018-06-04 | Discharge: 2018-06-04 | Disposition: A | Discharge status: Home / Self Care | Payer: Medicaid Other | Attending: Emergency Medicine | Admitting: Emergency Medicine

## 2018-06-04 ENCOUNTER — Other Ambulatory Visit: Payer: Self-pay

## 2018-06-04 DIAGNOSIS — M25552 Pain in left hip: Secondary | ICD-10-CM | POA: Diagnosis present

## 2018-06-04 DIAGNOSIS — F17210 Nicotine dependence, cigarettes, uncomplicated: Secondary | ICD-10-CM | POA: Diagnosis not present

## 2018-06-04 DIAGNOSIS — M5432 Sciatica, left side: Secondary | ICD-10-CM | POA: Diagnosis not present

## 2018-06-04 MED ORDER — HYDROCODONE-ACETAMINOPHEN 5-325 MG PO TABS: 1.0000 | ORAL_TABLET | Freq: Four times a day (QID) | ORAL | 0 refills | Status: DC | PRN

## 2018-06-04 MED ORDER — DEXAMETHASONE SODIUM PHOSPHATE 10 MG/ML IJ SOLN
10.0000 mg | Freq: Once | INTRAMUSCULAR | Status: AC
  Administered 2018-06-04: 10 mg via INTRAMUSCULAR
  Filled 2018-06-04: qty 1

## 2018-06-04 MED ORDER — KETOROLAC TROMETHAMINE 30 MG/ML IJ SOLN
30.0000 mg | Freq: Once | INTRAMUSCULAR | Status: AC
  Administered 2018-06-04: 30 mg via INTRAMUSCULAR
  Filled 2018-06-04: qty 1

## 2018-06-04 MED ORDER — DIAZEPAM 5 MG PO TABS
5.0000 mg | ORAL_TABLET | Freq: Once | ORAL | Status: AC
  Administered 2018-06-04: 22:00:00 5 mg via ORAL
  Filled 2018-06-04: qty 1

## 2018-06-04 MED ORDER — PREDNISONE 10 MG (21) PO TBPK: ORAL_TABLET | ORAL | 0 refills | Status: DC

## 2018-06-04 MED ORDER — CYCLOBENZAPRINE HCL 10 MG PO TABS: 10.0000 mg | ORAL_TABLET | Freq: Three times a day (TID) | ORAL | 0 refills | Status: DC | PRN

## 2018-06-04 NOTE — ED Triage Notes (Signed)
Pt in with co lower back and left leg pain states has hx of sciatica.

## 2018-06-04 NOTE — ED Notes (Signed)
This RN reviewed discharge instructions, follow-up care, prescriptions, and cryotherapy/heat with patient. Patient verbalized understanding of all reviewed information.  Patient stable, with no distress noted at this time.

## 2018-06-04 NOTE — ED Notes (Signed)
Patient c/o lower left back pain radiating down left leg X 1 month. Patient reports seen in this ED for same approx 1 week ago. Patient reports that he was dx with sciatica and given a shot and told to take existing prednisone prescription. Patient reports that the shot relieved pain temporarily, however, denies relief of pain/symptoms with steroids.   Patient also reports swelling/tightness to left calf. No appreciable swelling seen on assessment.

## 2018-06-04 NOTE — ED Notes (Signed)
ED Provider at bedside. 

## 2018-06-04 NOTE — ED Provider Notes (Signed)
[facility] Emergency Department Provider Note ____________________________________________  Time seen: Approximately 10:24 PM  I have reviewed the triage vital signs and the nursing notes.  HISTORY  Chief Complaint Back Pain   HPI Trevor Zimmerman is a 33 y.o. male who presents to the emergency department for treatment and evaluation of left hip pain that radiates into the left foot.  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  Pain is been present for the month but has been worsening.  Primary care prescribed prednisone and that did help, but he finished that a couple of days ago.  He was evaluated here about a week ago and was given muscle relaxer and pain medication.  He states this helped but he is out of both.  He states that his left calf is cramping now as well.  He he has had no relief with heat pad, ice, ibuprofen, or Tylenol. Past Medical History:  Diagnosis Date  . [facility])     Patient Active Problem List   Diagnosis Date Noted  . Leukocytosis 01/29/2016  . Hyponatremia 01/29/2016  . Tobacco abuse counseling 01/29/2016  . Fluid overload 01/29/2016  . COPD exacerbation ([place]) 01/29/2016  . Multifocal pneumonia 01/27/2016  . Persistent cough 01/27/2016  . Pleuritic chest pain 01/27/2016  . SOB (shortness of breath) 01/27/2016  . Community acquired pneumonia of left lower lobe of lung ([place])   . Generalized abdominal pain     Past Surgical History:  Procedure Laterality Date  . 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 Authorizing Provider  albuterol (PROVENTIL HFA;VENTOLIN HFA) 108 (90 Base) MCG/ACT inhaler Inhale 2 puffs into the lungs every 6 (six) hours as needed for wheezing or shortness of breath. 01/27/16   Lisa Roca, MD  cyclobenzaprine  (FLEXERIL) 10 MG tablet Take 1 tablet (10 mg total) by mouth 3 (three) times daily as needed. 06/04/18   Terre Hanneman, Johnette Abraham B, FNP  HYDROcodone-acetaminophen (NORCO/VICODIN) 5-325 MG tablet Take 1 tablet by mouth every 6 (six) hours as needed for moderate pain. 06/04/18   Travaris Kosh, Johnette Abraham B, FNP  ibuprofen (ADVIL,MOTRIN) 800 MG tablet Take 1 tablet (800 mg total) by mouth every 8 (eight) hours as needed. 02/14/17   Laban Emperor, PA-C  mupirocin ointment (BACTROBAN) 2 % Place 1 application into the nose 2 (two) times daily. 01/29/16   Theodoro Grist, MD  nicotine (NICODERM CQ - DOSED IN MG/24 HOURS) 21 mg/24hr patch Place 1 patch (21 mg total) onto the skin daily. 01/30/16   Theodoro Grist, MD  predniSONE (STERAPRED UNI-PAK 21 TAB) 10 MG (21) TBPK tablet Take 6 tablets on the first day and decrease by 1 tablet each day until finished. 06/04/18   Victorino Dike, FNP    Allergies Patient has no known allergies.  No family history on file.  Social History Social History   Tobacco Use  . Smoking status: Current Every Day Smoker    Packs/day: 0.25    Types: Cigarettes  . Smokeless tobacco: Never Used  Substance Use Topics  . Alcohol use: No  . Drug use: No    Review of Systems Constitutional: Well appearing. Respiratory: Negative for dyspnea. Cardiovascular: Negative for change in skin temperature or color. Musculoskeletal:   Negative for chronic  steroid use   Negative for trauma in the presence of osteoporosis  Negative for age over 49 and trauma.  Negative for constitutional symptoms, or history of cancer   Negative for pain worse at night. Skin: Negative for rash, lesion, or wound.  Genitourinary: Negative for urinary retention. Rectal: Negative for fecal incontinence or new onset constipation/bowel habit changes. Hematological/Immunilogical: Negative for immunosuppression, IV drug use, or fever Neurological: Positive for burning, tingling, numb, electric, radiating pain in the left  lower extremity.                        Negative for saddle anesthesia.                        Negative for focal neurologic deficit, progressive or disabling symptoms             Negative for saddle anesthesia. ____________________________________________   PHYSICAL EXAM:  VITAL SIGNS: ED Triage Vitals  Enc Vitals Group     BP 06/04/18 2107 137/71     Pulse Rate 06/04/18 2107 88     Resp 06/04/18 2107 18     Temp 06/04/18 2107 98 F (36.7 C)     Temp Source 06/04/18 2107 Oral     SpO2 06/04/18 2107 100 %     Weight 06/04/18 2106 130 lb (59 kg)     Height 06/04/18 2106 5\' 6"  (1.676 m)     Head Circumference --      Peak Flow --      Pain Score 06/04/18 2106 10     Pain Loc --      Pain Edu? --      Excl. in [place]? --     Constitutional: Alert and oriented. Well appearing and in no acute distress. Eyes: Conjunctivae are clear without discharge or drainage.  Head: Atraumatic. Neck: Full, active range of motion. Respiratory: Respirations even and unlabored. Musculoskeletal: Limited ROM of the back and extremities, Strength 4/5 of the lower extremities as tested.  Muscle spasm of the left calf observed. Neurologic: Reflexes of the lower extremities are 2+.  Positive straight leg raise on the left side. Skin: Atraumatic.  Psychiatric: Behavior and affect are normal.  ____________________________________________   LABS (all labs ordered are listed, but only abnormal results are displayed)  Labs Reviewed - No data to display ____________________________________________  RADIOLOGY  Not indicated ____________________________________________   PROCEDURES  Procedure(s) performed:  Procedures ____________________________________________   INITIAL IMPRESSION / ASSESSMENT AND PLAN / ED COURSE  Trevor Zimmerman is a 33 y.o. male who presents to the emergency department for treatment of left side back, hip, and lower extremity pain and paresthesia.  Patient appears to  be uncomfortable on exam.  While here, he was given Valium 5 mg to help relax muscle.  This did provide him with some significant relief.  He was also given an injection of Decadron.  He was strongly encouraged to call orthopedics in the morning to request an appointment.  He was also encouraged to stay off work for the next few days as well.  Prescriptions for prednisone, Flexeril, and Norco will be submitted to his pharmacy.  He was encouraged to return to the emergency department for any symptom that changes or worsens if he is unable to schedule an appointment.  Medications  dexamethasone (DECADRON) injection 10 mg (10 mg Intramuscular Given 06/04/18 2142)  ketorolac (TORADOL) 30 MG/ML injection 30 mg (30 mg Intramuscular  Given 06/04/18 2140)  diazepam (VALIUM) tablet 5 mg (5 mg Oral Given 06/04/18 2139)    ED Discharge Orders         Ordered    predniSONE (STERAPRED UNI-PAK 21 TAB) 10 MG (21) TBPK tablet     06/04/18 2222    HYDROcodone-acetaminophen (NORCO/VICODIN) 5-325 MG tablet  Every 6 hours PRN     06/04/18 2222    cyclobenzaprine (FLEXERIL) 10 MG tablet  3 times daily PRN     06/04/18 2222           Pertinent labs & imaging results that were available during my care of the patient were reviewed by me and considered in my medical decision making (see chart for details).  _________________________________________   FINAL CLINICAL IMPRESSION(S) / ED DIAGNOSES  Final diagnoses:  Sciatica of left side     If controlled substance prescribed during this visit, 12 month history viewed on the [place] prior to issuing an initial prescription for Schedule II or III opiod.   Victorino Dike, FNP 06/05/18 2158    Schuyler Amor, MD 06/05/18 2333

## 2018-06-04 NOTE — Discharge Instructions (Signed)
Please read and follow the sciatica instructions.  Call tomorrow morning to schedule an appointment with orthopedics.  Return to the ER for symptoms that change or worsen if unable to schedule an appointment.

## 2018-06-10 ENCOUNTER — Other Ambulatory Visit: Payer: Self-pay | Admitting: Orthopedic Surgery

## 2018-06-10 ENCOUNTER — Other Ambulatory Visit (HOSPITAL_COMMUNITY): Payer: Self-pay | Admitting: Orthopedic Surgery

## 2018-06-10 DIAGNOSIS — M5416 Radiculopathy, lumbar region: Secondary | ICD-10-CM

## 2018-06-10 DIAGNOSIS — M5442 Lumbago with sciatica, left side: Secondary | ICD-10-CM

## 2018-06-17 ENCOUNTER — Other Ambulatory Visit: Payer: Self-pay

## 2018-06-17 ENCOUNTER — Ambulatory Visit
Admission: RE | Admit: 2018-06-17 | Discharge: 2018-06-17 | Disposition: A | Discharge status: Home / Self Care | Payer: Medicaid Other | Source: Ambulatory Visit | Attending: Orthopedic Surgery | Admitting: Orthopedic Surgery

## 2018-06-17 DIAGNOSIS — M5442 Lumbago with sciatica, left side: Secondary | ICD-10-CM | POA: Diagnosis not present

## 2018-06-17 DIAGNOSIS — M5416 Radiculopathy, lumbar region: Secondary | ICD-10-CM

## 2018-07-21 ENCOUNTER — Ambulatory Visit: Payer: Self-pay

## 2018-07-23 ENCOUNTER — Other Ambulatory Visit: Payer: Self-pay

## 2018-07-23 ENCOUNTER — Ambulatory Visit
Admission: RE | Admit: 2018-07-23 | Discharge: 2018-07-23 | Disposition: A | Discharge status: Home / Self Care | Payer: Medicaid Other | Source: Ambulatory Visit | Attending: Neurosurgery | Admitting: Neurosurgery

## 2018-07-23 ENCOUNTER — Encounter
Admission: RE | Admit: 2018-07-23 | Discharge: 2018-07-23 | Disposition: A | Discharge status: Home / Self Care | Payer: Medicaid Other | Source: Ambulatory Visit | Attending: Neurosurgery | Admitting: Neurosurgery

## 2018-07-23 DIAGNOSIS — Z01810 Encounter for preprocedural cardiovascular examination: Secondary | ICD-10-CM | POA: Diagnosis not present

## 2018-07-23 DIAGNOSIS — Z01818 Encounter for other preprocedural examination: Secondary | ICD-10-CM | POA: Insufficient documentation

## 2018-07-23 DIAGNOSIS — Z1159 Encounter for screening for other viral diseases: Secondary | ICD-10-CM | POA: Insufficient documentation

## 2018-07-23 LAB — URINALYSIS, ROUTINE W REFLEX MICROSCOPIC
Bacteria, UA: NONE SEEN
Bilirubin Urine: NEGATIVE
Glucose, UA: NEGATIVE mg/dL
Hgb urine dipstick: NEGATIVE
Ketones, ur: NEGATIVE mg/dL
Leukocytes,Ua: NEGATIVE
Nitrite: NEGATIVE
Protein, ur: NEGATIVE mg/dL
Specific Gravity, Urine: 1.029 (ref 1.005–1.030)
Squamous Epithelial / HPF: NONE SEEN (ref 0–5)
WBC, UA: NONE SEEN WBC/hpf (ref 0–5)
pH: 5 (ref 5.0–8.0)

## 2018-07-23 LAB — BASIC METABOLIC PANEL
Anion gap: 10 (ref 5–15)
BUN: 17 mg/dL (ref 6–20)
CO2: 28 mmol/L (ref 22–32)
Calcium: 9.8 mg/dL (ref 8.9–10.3)
Chloride: 101 mmol/L (ref 98–111)
Creatinine, Ser: 0.84 mg/dL (ref 0.61–1.24)
GFR calc Af Amer: >60 mL/min (ref >60)
GFR calc non Af Amer: >60 mL/min (ref >60)
Glucose, Bld: 89 mg/dL (ref 70–99)
Potassium: 4.2 mmol/L (ref 3.5–5.1)
Sodium: 139 mmol/L (ref 135–145)

## 2018-07-23 LAB — CBC
HCT: 41.3 % (ref 39.0–52.0)
Hemoglobin: 14.2 g/dL (ref 13.0–17.0)
MCH: 31 pg (ref 26.0–34.0)
MCHC: 34.4 g/dL (ref 30.0–36.0)
MCV: 90.2 fL (ref 80.0–100.0)
Platelets: 366 10*3/uL (ref 150–400)
RBC: 4.58 MIL/uL (ref 4.22–5.81)
RDW: 11.9 % (ref 11.5–15.5)
WBC: 8.1 10*3/uL (ref 4.0–10.5)

## 2018-07-23 LAB — PROTIME-INR
INR: 1 (ref 0.8–1.2)
Prothrombin Time: 13.5 seconds (ref 11.4–15.2)

## 2018-07-23 LAB — APTT: aPTT: 31 seconds (ref 24–36)

## 2018-07-23 NOTE — Patient Instructions (Signed)
  Your procedure is scheduled on: Monday July 27, 2018 Report to Same Day Surgery 2nd floor [facility] Entrance-take elevator on left to 2nd floor.  Check in with surgery information desk.) To find out your arrival time, call [phone] 1:00-3:00 PM on Friday July 24, 2018  Remember: Instructions that are not followed completely may result in serious medical risk, up to and including death, or upon the discretion of your surgeon and anesthesiologist your surgery may need to be rescheduled.    __x__ 1. Do not eat food (including mints, candies, chewing gum) after midnight the night before your procedure. You may drink clear liquids up to 2 hours before you are scheduled to arrive at the hospital for your procedure.  Do not drink anything within 2 hours of your scheduled arrival to the hospital.  Approved clear liquids:  --Water or Apple juice without pulp  --Clear carbohydrate beverage such as Gatorade or Powerade  --Black Coffee or Clear Tea (No milk, no creamers, do not add anything to the coffee or tea)    __x__ 2. No Alcohol for 24 hours before or after surgery.   __x__ 3. No Smoking or e-cigarettes for 24 hours before surgery.  Do not use any chewable tobacco products for at least 6 hours before surgery.   __x__ 4. Notify your doctor if there is any change in your medical condition (cold, fever, infections).   __x__ 5. On the morning of surgery brush your teeth with toothpaste and water.  You may rinse your mouth with mouthwash if you wish.  Do not swallow any toothpaste or mouthwash.  Please read over the following fact sheets that you were given:   [facility] Preparing for Surgery and/or MRSA Information    __x__ Use CHG Soap or Sage wipes as directed on instruction sheet.   Do not wear jewelry, lotions, perfumes, powders or deodorant on the day of surgery.  Do not shave below the face/neck 48 hours prior to surgery.   Do not bring valuables to the hospital.     [facility] is not responsible for any belongings or valuables.    For patients admitted to the hospital, discharge time is determined by your treatment team.  For patients discharged on the day of surgery, you will NOT be permitted to drive yourself home.  You must have a responsible adult with you for 24 hours after surgery.  __x__ Take these medicines on the morning of surgery with a small sip of water:  1. Tramadol if needed  2. Gabapentin if needed  ____ Follow recommendations from Cardiologist, Pulmonologist or PCP regarding stopping blood thinners Aspirin, Coumadin, Plavix, Eliquis, Effient, Pradaxa, and Pletal.  __x__ TODAY: Do not take any Anti-inflammatories such as Advil, Ibuprofen, Motrin, Aleve, Naproxen, Naprosyn, BC/Goodies powders or aspirin products. You may continue to take Tylenol and Celebrex.   __x__ TODAY: Do not take any over the counter supplements until after surgery. You may continue to take Vitamin D, Vitamin B, and multivitamin.

## 2018-07-24 LAB — TYPE AND SCREEN
ABO/RH(D): O POS
Antibody Screen: NEGATIVE

## 2018-07-24 LAB — NOVEL CORONAVIRUS, NAA (HOSP ORDER, SEND-OUT TO REF LAB; TAT 18-24 HRS): SARS-CoV-2, NAA: NOT DETECTED

## 2018-07-27 NOTE — Anesthesia Preprocedure Evaluation (Addendum)
Anesthesia Evaluation  Patient identified by MRN, date of birth, ID band Patient awake    Reviewed: Allergy & Precautions, H&P , NPO status , Patient's Chart, lab work & pertinent test results  Airway Mallampati: II  TM Distance: >3 FB Neck ROM: full    Dental  (+) Poor Dentition, Chipped Very poor dentition:   Pulmonary Current Smoker,           Cardiovascular negative cardio ROS       Neuro/Psych negative neurological ROS  negative psych ROS   GI/Hepatic negative GI ROS, Neg liver ROS,   Endo/Other  negative endocrine ROS  Renal/GU      Musculoskeletal   Abdominal   Peds  Hematology negative hematology ROS (+)   Anesthesia Other Findings Past Medical History: No date: Cancer [facility])   Past Surgical History: No date: 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)     Reproductive/Obstetrics negative OB ROS                            Anesthesia Physical Anesthesia Plan  ASA: III  Anesthesia Plan: General ETT   Post-op Pain Management:    Induction:   PONV Risk Score and Plan: Ondansetron, Dexamethasone, Midazolam and Treatment may vary due to age or medical condition  Airway Management Planned:   Additional Equipment:   Intra-op Plan:   Post-operative Plan:   Informed Consent: I have reviewed the patients History and Physical, chart, labs and discussed the procedure including the risks, benefits and alternatives for the proposed anesthesia with the patient or authorized representative who has indicated his/her understanding and acceptance.     Dental Advisory Given  Plan Discussed with: Anesthesiologist and CRNA  Anesthesia Plan Comments:        Anesthesia Quick Evaluation

## 2018-07-31 ENCOUNTER — Ambulatory Visit: Payer: Medicaid Other

## 2018-07-31 ENCOUNTER — Ambulatory Visit: Payer: Medicaid Other | Admitting: Anesthesiology

## 2018-07-31 ENCOUNTER — Encounter
Admission: RE | Disposition: A | Discharge status: Home / Self Care | Payer: Self-pay | Source: Home / Self Care | Attending: Neurosurgery

## 2018-07-31 ENCOUNTER — Ambulatory Visit
Admission: RE | Admit: 2018-07-31 | Discharge: 2018-07-31 | Disposition: A | Discharge status: Home / Self Care | Payer: Medicaid Other | Attending: Neurosurgery | Admitting: Neurosurgery

## 2018-07-31 ENCOUNTER — Encounter: Payer: Self-pay | Admitting: *Deleted

## 2018-07-31 ENCOUNTER — Other Ambulatory Visit: Payer: Self-pay

## 2018-07-31 DIAGNOSIS — M5418 Radiculopathy, sacral and sacrococcygeal region: Secondary | ICD-10-CM | POA: Insufficient documentation

## 2018-07-31 DIAGNOSIS — Z79899 Other long term (current) drug therapy: Secondary | ICD-10-CM | POA: Insufficient documentation

## 2018-07-31 DIAGNOSIS — Z419 Encounter for procedure for purposes other than remedying health state, unspecified: Secondary | ICD-10-CM

## 2018-07-31 DIAGNOSIS — M48061 Spinal stenosis, lumbar region without neurogenic claudication: Secondary | ICD-10-CM | POA: Insufficient documentation

## 2018-07-31 DIAGNOSIS — F17210 Nicotine dependence, cigarettes, uncomplicated: Secondary | ICD-10-CM | POA: Insufficient documentation

## 2018-07-31 HISTORY — PX: LUMBAR LAMINECTOMY/DECOMPRESSION MICRODISCECTOMY: SHX5026

## 2018-07-31 LAB — ABO/RH: ABO/RH(D): O POS

## 2018-07-31 SURGERY — LUMBAR LAMINECTOMY/DECOMPRESSION MICRODISCECTOMY 1 LEVEL
Anesthesia: General | Site: Back | Laterality: Left

## 2018-07-31 MED ORDER — DEXMEDETOMIDINE HCL IN NACL 80 MCG/20ML IV SOLN
INTRAVENOUS | Status: AC
  Filled 2018-07-31: qty 20

## 2018-07-31 MED ORDER — PROPOFOL 10 MG/ML IV BOLUS
INTRAVENOUS | Status: DC | PRN
  Administered 2018-07-31: 120 mg via INTRAVENOUS
  Administered 2018-07-31: 50 mg via INTRAVENOUS

## 2018-07-31 MED ORDER — PROPOFOL 10 MG/ML IV BOLUS
INTRAVENOUS | Status: AC
  Filled 2018-07-31: qty 20

## 2018-07-31 MED ORDER — LIDOCAINE HCL (CARDIAC) PF 100 MG/5ML IV SOSY
PREFILLED_SYRINGE | INTRAVENOUS | Status: DC | PRN
  Administered 2018-07-31: 60 mg via INTRAVENOUS

## 2018-07-31 MED ORDER — MIDAZOLAM HCL 2 MG/2ML IJ SOLN
INTRAMUSCULAR | Status: AC
  Filled 2018-07-31: qty 2

## 2018-07-31 MED ORDER — DEXAMETHASONE SODIUM PHOSPHATE 10 MG/ML IJ SOLN
INTRAMUSCULAR | Status: AC
  Filled 2018-07-31: qty 1

## 2018-07-31 MED ORDER — SODIUM CHLORIDE (PF) 0.9 % IJ SOLN
INTRAMUSCULAR | Status: AC
  Filled 2018-07-31: qty 10

## 2018-07-31 MED ORDER — FAMOTIDINE 20 MG PO TABS
20.0000 mg | ORAL_TABLET | Freq: Once | ORAL | Status: AC
  Administered 2018-07-31: 06:00:00 20 mg via ORAL

## 2018-07-31 MED ORDER — SODIUM CHLORIDE 0.9 % IV SOLN
INTRAVENOUS | Status: DC | PRN
  Administered 2018-07-31: .2 ug/kg/min via INTRAVENOUS

## 2018-07-31 MED ORDER — DEXMEDETOMIDINE HCL 200 MCG/2ML IV SOLN
INTRAVENOUS | Status: DC | PRN
  Administered 2018-07-31: 8 ug via INTRAVENOUS

## 2018-07-31 MED ORDER — FENTANYL CITRATE (PF) 100 MCG/2ML IJ SOLN
INTRAMUSCULAR | Status: AC
  Filled 2018-07-31: qty 2

## 2018-07-31 MED ORDER — THROMBIN 5000 UNITS EX SOLR
CUTANEOUS | Status: AC
  Filled 2018-07-31: qty 5000

## 2018-07-31 MED ORDER — FENTANYL CITRATE (PF) 100 MCG/2ML IJ SOLN
INTRAMUSCULAR | Status: DC | PRN
  Administered 2018-07-31 (×2): 50 ug via INTRAVENOUS

## 2018-07-31 MED ORDER — ONDANSETRON HCL 4 MG/2ML IJ SOLN
INTRAMUSCULAR | Status: DC | PRN
  Administered 2018-07-31: 4 mg via INTRAVENOUS

## 2018-07-31 MED ORDER — LIDOCAINE-EPINEPHRINE (PF) 1 %-1:200000 IJ SOLN
INTRAMUSCULAR | Status: DC | PRN
  Administered 2018-07-31: 4 mL

## 2018-07-31 MED ORDER — THROMBIN 5000 UNITS EX SOLR
CUTANEOUS | Status: DC | PRN
  Administered 2018-07-31: 5000 [IU] via TOPICAL

## 2018-07-31 MED ORDER — LIDOCAINE-EPINEPHRINE (PF) 1 %-1:200000 IJ SOLN
INTRAMUSCULAR | Status: AC
  Filled 2018-07-31: qty 30

## 2018-07-31 MED ORDER — METHYLPREDNISOLONE ACETATE 40 MG/ML IJ SUSP
INTRAMUSCULAR | Status: DC | PRN
  Administered 2018-07-31: 40 mg

## 2018-07-31 MED ORDER — FAMOTIDINE 20 MG PO TABS
ORAL_TABLET | ORAL | Status: AC
  Administered 2018-07-31: 06:00:00 20 mg via ORAL
  Filled 2018-07-31: qty 1

## 2018-07-31 MED ORDER — TRAMADOL HCL 50 MG PO TABS: 50.0000 mg | ORAL_TABLET | ORAL | 0 refills | Status: AC | PRN

## 2018-07-31 MED ORDER — KETOROLAC TROMETHAMINE 30 MG/ML IJ SOLN
INTRAMUSCULAR | Status: DC | PRN
  Administered 2018-07-31: 30 mg via INTRAVENOUS

## 2018-07-31 MED ORDER — METHYLPREDNISOLONE ACETATE 40 MG/ML IJ SUSP
INTRAMUSCULAR | Status: AC
  Filled 2018-07-31: qty 1

## 2018-07-31 MED ORDER — SODIUM CHLORIDE 0.9 % IV SOLN
INTRAVENOUS | Status: DC | PRN
  Administered 2018-07-31: 20 ug/min via INTRAVENOUS

## 2018-07-31 MED ORDER — LACTATED RINGERS IV SOLN
INTRAVENOUS | Status: DC
  Administered 2018-07-31: 07:00:00 via INTRAVENOUS

## 2018-07-31 MED ORDER — BUPIVACAINE HCL 0.5 % IJ SOLN
INTRAMUSCULAR | Status: DC | PRN
  Administered 2018-07-31: 15 mL

## 2018-07-31 MED ORDER — CEFAZOLIN SODIUM-DEXTROSE 2-4 GM/100ML-% IV SOLN
2.0000 g | INTRAVENOUS | Status: AC
  Administered 2018-07-31: 2 g via INTRAVENOUS

## 2018-07-31 MED ORDER — METHOCARBAMOL 500 MG PO TABS: 500.0000 mg | ORAL_TABLET | Freq: Four times a day (QID) | ORAL | 0 refills | Status: AC | PRN

## 2018-07-31 MED ORDER — LIDOCAINE HCL (PF) 2 % IJ SOLN
INTRAMUSCULAR | Status: AC
  Filled 2018-07-31: qty 10

## 2018-07-31 MED ORDER — EPHEDRINE SULFATE 50 MG/ML IJ SOLN
INTRAMUSCULAR | Status: AC
  Filled 2018-07-31: qty 1

## 2018-07-31 MED ORDER — ACETAMINOPHEN 10 MG/ML IV SOLN
INTRAVENOUS | Status: AC
  Filled 2018-07-31: qty 100

## 2018-07-31 MED ORDER — SUCCINYLCHOLINE CHLORIDE 20 MG/ML IJ SOLN
INTRAMUSCULAR | Status: AC
  Filled 2018-07-31: qty 1

## 2018-07-31 MED ORDER — ONDANSETRON HCL 4 MG/2ML IJ SOLN
INTRAMUSCULAR | Status: AC
  Filled 2018-07-31: qty 2

## 2018-07-31 MED ORDER — BUPIVACAINE HCL (PF) 0.5 % IJ SOLN
INTRAMUSCULAR | Status: AC
  Filled 2018-07-31: qty 30

## 2018-07-31 MED ORDER — HYDROMORPHONE HCL 1 MG/ML IJ SOLN: 0.2500 mg | INTRAMUSCULAR | Status: DC | PRN

## 2018-07-31 MED ORDER — MIDAZOLAM HCL 2 MG/2ML IJ SOLN
INTRAMUSCULAR | Status: DC | PRN
  Administered 2018-07-31: 2 mg via INTRAVENOUS

## 2018-07-31 MED ORDER — PHENYLEPHRINE HCL (PRESSORS) 10 MG/ML IV SOLN
INTRAVENOUS | Status: AC
  Filled 2018-07-31: qty 1

## 2018-07-31 MED ORDER — DEXAMETHASONE SODIUM PHOSPHATE 10 MG/ML IJ SOLN
INTRAMUSCULAR | Status: DC | PRN
  Administered 2018-07-31: 5 mg via INTRAVENOUS

## 2018-07-31 MED ORDER — CEFAZOLIN SODIUM-DEXTROSE 2-4 GM/100ML-% IV SOLN
INTRAVENOUS | Status: AC
  Filled 2018-07-31: qty 100

## 2018-07-31 MED ORDER — KETOROLAC TROMETHAMINE 30 MG/ML IJ SOLN
INTRAMUSCULAR | Status: AC
  Filled 2018-07-31: qty 1

## 2018-07-31 MED ORDER — ACETAMINOPHEN 10 MG/ML IV SOLN
INTRAVENOUS | Status: DC | PRN
  Administered 2018-07-31: 1000 mg via INTRAVENOUS

## 2018-07-31 MED ORDER — REMIFENTANIL HCL 1 MG IV SOLR
INTRAVENOUS | Status: AC
  Filled 2018-07-31: qty 1000

## 2018-07-31 MED ORDER — SUCCINYLCHOLINE CHLORIDE 20 MG/ML IJ SOLN
INTRAMUSCULAR | Status: DC | PRN
  Administered 2018-07-31: 80 mg via INTRAVENOUS

## 2018-07-31 SURGICAL SUPPLY — 56 items
BUR NEURO DRILL SOFT 3.0X3.8M (BURR) ×3 IMPLANT
CANISTER SUCT 1200ML W/VALVE (MISCELLANEOUS) ×6 IMPLANT
CHLORAPREP W/TINT 26 (MISCELLANEOUS) ×6 IMPLANT
COUNTER NEEDLE 20/40 LG (NEEDLE) ×3 IMPLANT
COVER LIGHT HANDLE STERIS (MISCELLANEOUS) ×6 IMPLANT
COVER WAND RF STERILE (DRAPES) ×3 IMPLANT
CUP MEDICINE 2OZ PLAST GRAD ST (MISCELLANEOUS) ×3 IMPLANT
DERMABOND ADVANCED (GAUZE/BANDAGES/DRESSINGS) ×2
DERMABOND ADVANCED .7 DNX12 (GAUZE/BANDAGES/DRESSINGS) ×1 IMPLANT
DRAPE C-ARM 42X72 X-RAY (DRAPES) ×6 IMPLANT
DRAPE LAPAROTOMY 100X77 ABD (DRAPES) ×3 IMPLANT
DRAPE MICROSCOPE SPINE 48X150 (DRAPES) IMPLANT
DRAPE SURG 17X11 SM STRL (DRAPES) ×3 IMPLANT
DURASEAL APPLICATOR TIP (TIP) IMPLANT
DURASEAL SPINE SEALANT 3ML (MISCELLANEOUS) IMPLANT
ELECT CAUTERY BLADE TIP 2.5 (TIP) ×3
ELECT EZSTD 165MM 6.5IN (MISCELLANEOUS) ×3
ELECT REM PT RETURN 9FT ADLT (ELECTROSURGICAL) ×3
ELECTRODE CAUTERY BLDE TIP 2.5 (TIP) ×1 IMPLANT
ELECTRODE EZSTD 165MM 6.5IN (MISCELLANEOUS) ×1 IMPLANT
ELECTRODE REM PT RTRN 9FT ADLT (ELECTROSURGICAL) ×1 IMPLANT
GAUZE SPONGE 4X4 12PLY STRL (GAUZE/BANDAGES/DRESSINGS) ×3 IMPLANT
GLOVE BIOGEL PI IND STRL 7.0 (GLOVE) ×1 IMPLANT
GLOVE BIOGEL PI INDICATOR 7.0 (GLOVE) ×2
GLOVE INDICATOR 8.0 STRL GRN (GLOVE) ×12 IMPLANT
GLOVE SURG SYN 7.0 (GLOVE) ×9 IMPLANT
GLOVE SURG SYN 8.0 (GLOVE) ×3 IMPLANT
GOWN STRL REUS W/ TWL XL LVL3 (GOWN DISPOSABLE) ×2 IMPLANT
GOWN STRL REUS W/TWL MED LVL3 (GOWN DISPOSABLE) ×3 IMPLANT
GOWN STRL REUS W/TWL XL LVL3 (GOWN DISPOSABLE) ×4
GRADUATE 1200CC STRL 31836 (MISCELLANEOUS) ×3 IMPLANT
KIT TURNOVER KIT A (KITS) ×3 IMPLANT
KIT WILSON FRAME (KITS) ×3 IMPLANT
KNIFE BAYONET SHORT DISCETOMY (MISCELLANEOUS) ×3 IMPLANT
MARKER SKIN DUAL TIP RULER LAB (MISCELLANEOUS) ×6 IMPLANT
NDL SAFETY ECLIPSE 18X1.5 (NEEDLE) ×1 IMPLANT
NEEDLE HYPO 18GX1.5 SHARP (NEEDLE) ×2
NEEDLE HYPO 22GX1.5 SAFETY (NEEDLE) ×3 IMPLANT
NS IRRIG 1000ML POUR BTL (IV SOLUTION) ×3 IMPLANT
PACK LAMINECTOMY NEURO (CUSTOM PROCEDURE TRAY) ×3 IMPLANT
PAD ARMBOARD 7.5X6 YLW CONV (MISCELLANEOUS) ×3 IMPLANT
SPOGE SURGIFLO 8M (HEMOSTASIS) ×2
SPONGE SURGIFLO 8M (HEMOSTASIS) ×1 IMPLANT
STAPLER SKIN PROX 35W (STAPLE) IMPLANT
SUT NURALON 4 0 TR CR/8 (SUTURE) IMPLANT
SUT POLYSORB 2-0 5X18 GS-10 (SUTURE) ×3 IMPLANT
SUT VIC AB 0 CT1 18XCR BRD 8 (SUTURE) ×1 IMPLANT
SUT VIC AB 0 CT1 8-18 (SUTURE) ×2
SYR 10ML LL (SYRINGE) ×6 IMPLANT
SYR 30ML LL (SYRINGE) ×3 IMPLANT
SYR 3ML LL SCALE MARK (SYRINGE) ×3 IMPLANT
SYR 5ML 18GX1 1/2 (NEEDLE) ×3 IMPLANT
TOWEL OR 17X26 4PK STRL BLUE (TOWEL DISPOSABLE) ×12 IMPLANT
TUBE METRX 18MMX5CM (INSTRUMENTS) ×3 IMPLANT
TUBING CONNECTING 10 (TUBING) ×2 IMPLANT
TUBING CONNECTING 10' (TUBING) ×1

## 2018-07-31 NOTE — Discharge Summary (Signed)
Procedure: L5-S1 lumbar decompression Procedure date: 07/31/2018 Diagnosis: Lumbar radiculopathy  History: Trevor Zimmerman is s/p L5-S1 lumbar decompression for lumbar radiculopathy  POD0: Tolerated procedure well without complication.  Seen postoperatively still disoriented from anesthesia but able to answer questions and obey commands.  Denies any complaints at this time.  Symptoms that were present prior to surgery have resolved.  Physical Exam: Vitals:   07/31/18 0612  BP: 103/68  Pulse: 80  Resp: 16  Temp: 97.8 F (36.6 C)  SpO2: 100%   Strength: At least 4+/5 throughout lower extremities.  Sensation: Intact and symmetric throughout lower extremities Skin: Glue intact at incision site  Data:  No results for input(s): NA, K, CL, CO2, BUN, CREATININE, LABGLOM, GLUCOSE, CALCIUM in the last 168 hours. No results for input(s): AST, ALT, ALKPHOS in the last 168 hours.  Invalid input(s): TBILI   No results for input(s): WBC, HGB, HCT, PLT in the last 168 hours. No results for input(s): APTT, INR in the last 168 hours.       Other tests/results: No imaging reviewed  Assessment/Plan:  Trevor Zimmerman is POD 0 status post L5-S1 lumbar decompression for lumbar radiculopathy.  Symptoms that were present prior to surgery seem to be resolved at this time.  Postop pain control with tramadol, muscle relaxer, Tylenol, ibuprofen as needed.  Scheduled follow-up in clinic in approximately 2 weeks to monitor progress.  Marin Olp PA-C Department of Neurosurgery

## 2018-07-31 NOTE — Interval H&P Note (Signed)
History and Physical Interval Note:  07/31/2018 6:54 AM  Trevor Zimmerman  has presented today for surgery, with the diagnosis of LUMBAR RADICULOPATHY.  The various methods of treatment have been discussed with the patient and family. After consideration of risks, benefits and other options for treatment, the patient has consented to  Procedure(s): LUMBAR LAMINECTOMY/DECOMPRESSION MICRODISCECTOMY 1 LEVEL LEFT L5-S1 HEMILAMINETOMY AND DISCETOMY (Left) as a surgical intervention.  The patient's history has been reviewed, patient examined, no change in status, stable for surgery.  I have reviewed the patient's chart and labs.  Questions were answered to the patient's satisfaction.     Deetta Perla

## 2018-07-31 NOTE — Op Note (Signed)
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 MRI revealed left L5/S1stenosis compressingthetraversingnerve root from a large disc protrusion. We discussed lumbar discectomy for symptom relief. Therisks of surgery were explained to include hematoma, infection, damage to nerve roots, CSF leak, weakness, numbness, pain, need for future surgery including fusion, heart attack, and stroke.She elected to proceed with surgery for symptom relief.   Procedure The patient was brought to the OR after informed consent was obtained.He was given general anesthesia and intubated by the anesthesia service. Vascular access lines were placed.The patient was then placed prone on a Wilson frameensuring all pressure points were padded.Antibiotics were administered.A time-out was performed per protocol.   The patient was sterilely prepped and draped. Fluoroscopy confirmedthe L5/S1interspaceandanincision was planned1.5cm off midlineon the left.The incision was instilled withlocal anesthetic with epinephrine. The skin was opened sharply and the dissection taken to the fascia. This was incised and the initial dilator placedthe spinous processes and lamina of L5on theleftout to the medial edge of the facet. Serial dilatorswere inserted via fluoroscopy and the final36mm tube was placed at  depth of5cm.  The microscope was brought into the field. The overlying muscle was removed from lamina and medial facet.Next, a matchstickdrill bit was used to remove theL5lamina centrallyand going laterally.The underlying ligament was freed and removed with combination of rongeurs. The decompression was taken caudal to the superior border of S1. The dura was seen to be full and intact.Once all ligament and soft tissue was removed, attention was turned to inspection of the nerve root. The nerve rootwas displaced medially due to a large disc bulgeand thecal sacwas retractedmedially.The disc space was coagulated and entered sharply. A large soft disc fragment was expressed. Curette and probe were used to remove multiple more fragments in the epidural space and beneath the PLL. The disc space was then seen to be flat with the vertebral bodies. Irrigation was used to clear out the disc space.  The epidural space was inspected and the nerve root followed laterally with any signs of compression.   The blunt tip probe was palpated around the nerve and no compression was found from that point and distally. Multiple rounds of irrigation were used. Hemostasis was obtained.Depomedrol was placed along the nerve root.The microscope was removed.Marcaine was placed in the muscle.   Themuscle andfasciawas then closed using 2-0vicryl followed by thesubcutaneous and dermal layers with 2-0 vicryluntil the epidermis was well approximated. The skin was closed withDermabond..  The patient was returned to supine position and extubated by the anesthesia service. The patient was then taken to the PACU for post-operative care wherehe was moving extremities symmetrically.   ESTIMATED BLOOD LOSS: 20cc  SPECIMENS None  IMPLANT None   I performed the case in its entiretywith assistance of PA, Trevor Zimmerman, [place]

## 2018-07-31 NOTE — H&P (Signed)
Trevor Zimmerman is an 32 y.o. male.   Chief Complaint: Leg pain HPI: Mr. Mikami is here for evaluation of ongoing symptoms in the left leg that radiates from the buttocks area down the posterior side of the thigh and leg to the foot. He states this started approximate 3 months ago and has been limiting his activity since then. He has undergone an epidural steroid injection recently and got no more than 1 day of relief. He is attempted NSAIDs, muscle relaxant, gabapentin without any significant relief. He is usually very active working 6 days a week, however he has been unable to do that at this time. He endorses some numbness in the same distribution of the pain but no focal weakness. He has never had any other procedures performed on his spine.  MRI showed a large disc herniation at L5/S1 and surgery for decompression was discussed.   Past Medical History:  Diagnosis Date  . Cancer [facility])     Past Surgical History:  Procedure Laterality Date  . 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 No pertinent family history. Social History:  reports that he has been smoking cigarettes. He has been smoking about 1.00 pack per day. He has never used smokeless tobacco. He reports that he does not drink alcohol or use drugs.  Allergies: No Known Allergies  Medications Prior to Admission  Medication Sig Dispense Refill  . gabapentin (NEURONTIN) 300 MG capsule Take 600 mg by mouth 2 (two) times daily.     . traMADol (ULTRAM) 50 MG tablet Take 100 mg by mouth every 6 (six) hours as needed for moderate pain.    Marland Kitchen albuterol (PROVENTIL HFA;VENTOLIN HFA) 108 (90 Base) MCG/ACT inhaler Inhale 2 puffs into the lungs every 6 (six) hours as needed for wheezing or shortness of breath. (Patient not taking: Reported on 07/21/2018) 1 Inhaler 0  . cyclobenzaprine (FLEXERIL) 10 MG tablet Take 1 tablet (10 mg total) by mouth 3 (three) times daily as needed. (Patient not taking: Reported on 07/21/2018) 30  tablet 0  . HYDROcodone-acetaminophen (NORCO/VICODIN) 5-325 MG tablet Take 1 tablet by mouth every 6 (six) hours as needed for moderate pain. (Patient not taking: Reported on 07/21/2018) 20 tablet 0  . ibuprofen (ADVIL,MOTRIN) 800 MG tablet Take 1 tablet (800 mg total) by mouth every 8 (eight) hours as needed. (Patient not taking: Reported on 07/21/2018) 30 tablet 0  . mupirocin ointment (BACTROBAN) 2 % Place 1 application into the nose 2 (two) times daily. (Patient not taking: Reported on 07/21/2018) 22 g 0  . nicotine (NICODERM CQ - DOSED IN MG/24 HOURS) 21 mg/24hr patch Place 1 patch (21 mg total) onto the skin daily. (Patient not taking: Reported on 07/21/2018) 28 patch 0  . predniSONE (STERAPRED UNI-PAK 21 TAB) 10 MG (21) TBPK tablet Take 6 tablets on the first day and decrease by 1 tablet each day until finished. (Patient not taking: Reported on 07/21/2018) 21 tablet 0    Results for orders placed or performed during the hospital encounter of 07/31/18 (from the past 48 hour(s))  ABO/Rh     Status: None   Collection Time: 07/31/18  6:21 AM  Result Value Ref Range   ABO/RH(D)      O POS Performed at [facility] [address] [place], [place] [address]    No results found.  ROS General ROS: Negative Psychological ROS: Negative Ophthalmic ROS: Negative ENT ROS: Negative Hematological and Lymphatic ROS: Negative  Endocrine  ROS: Negative Respiratory ROS: Negative Cardiovascular ROS: Negative Gastrointestinal ROS: Negative Genito-Urinary ROS: Negative Musculoskeletal ROS: Negative Neurological ROS: Positive for left leg pain, numbness Dermatological ROS: Negative  Blood pressure 103/68, pulse 80, temperature 97.8 F (36.6 C), temperature source Temporal, resp. rate 16, height 5\' 5"  (1.651 m), weight 55.4 kg, SpO2 100 %. Physical Exam  General appearance: Alert, cooperative, he is in obvious discomfort Head: Normocephalic, atraumatic Eyes: Normal, EOM intact Neck: Some  tenderness to palpation over the left paramedian region Ext: No edema in LE bilaterally  Neurologic exam:  Mental status: alertness: alert, affect: normal Speech: fluent and clear Motor:strength symmetric 5/5 in bilateral lower extremities in all motor groups Sensory: Decreased to Light touch in the lateral/posterior leg on the left Reflexes: 2+ and symmetric bilaterally for patella, 1+ at bilateral ankle Gait: Antalgic gait, using cane   MRI lumbar spine: There is straightening of the lordotic curvature. There is overall normal alignment. There is very healthy disc spaces from L4-5 and above with some degeneration of the L5-S1 disc space. There is a large disc protrusion in the lateral recess on the left at L5-S1. This results in severe stenosis. There are no other areas of central or foraminal stenosis noted   Assessment/Plan  1. Diagnosis: Left S1 radiculopathy  2. Plan L5/S1 hemilaminectomy discectomy in the left plan for 6/8, he will need preoperative x-rays and anesthesia evaluation    Deetta Perla, MD 07/31/2018, 6:52 AM

## 2018-07-31 NOTE — Anesthesia Procedure Notes (Signed)
Procedure Name: Intubation Date/Time: 07/31/2018 7:33 AM Performed by: Zetta Bills, CRNA Pre-anesthesia Checklist: Patient identified, Emergency Drugs available, Suction available and Patient being monitored Patient Re-evaluated:Patient Re-evaluated prior to induction Oxygen Delivery Method: Circle system utilized Preoxygenation: Pre-oxygenation with 100% oxygen Induction Type: IV induction Ventilation: Mask ventilation without difficulty Laryngoscope Size: Mac and 4 Grade View: Grade I Tube type: Oral Tube size: 7.0 mm Number of attempts: 1 Airway Equipment and Method: Patient positioned with wedge pillow and Stylet Placement Confirmation: ETT inserted through vocal cords under direct vision Secured at: 20 cm Tube secured with: Tape Dental Injury: Teeth and Oropharynx as per pre-operative assessment

## 2018-07-31 NOTE — Anesthesia Postprocedure Evaluation (Signed)
Anesthesia Post Note  Patient: Trevor Zimmerman  Procedure(s) Performed: LUMBAR LAMINECTOMY/DECOMPRESSION MICRODISCECTOMY 1 LEVEL LEFT L5-S1 HEMILAMINETOMY AND DISCETOMY (Left Back)  Patient location during evaluation: PACU Anesthesia Type: General Level of consciousness: awake and alert Pain management: pain level controlled Vital Signs Assessment: post-procedure vital signs reviewed and stable Respiratory status: spontaneous breathing, nonlabored ventilation and patient connected to nasal cannula oxygen Cardiovascular status: blood pressure returned to baseline and stable Postop Assessment: no apparent nausea or vomiting Anesthetic complications: no     Last Vitals:  Vitals:   07/31/18 1030 07/31/18 1041  BP: 106/78 112/77  Pulse: (!) 55 (!) 59  Resp: 15 16  Temp: 36.7 C 36.4 C  SpO2: 98% 100%    Last Pain:  Vitals:   07/31/18 1041  TempSrc: Temporal  PainSc: 0-No pain                 Durenda Hurt

## 2018-07-31 NOTE — Consult Note (Signed)
Pharmacy Antibiotic Note  Trevor Zimmerman is a 33 y.o. male admitted on 07/31/2018 for planned surgical procedure.  Pharmacy has been consulted for cefazolin pre-op dosing.  Plan: Cefazolin 2g IV 30-min pre-op has been ordered.   Height: 5\' 5"  (165.1 cm) Weight: 122 lb 3.2 oz (55.4 kg) IBW/kg (Calculated) : 61.5  Temp (24hrs), Avg:97.8 F (36.6 C), Min:97.8 F (36.6 C), Max:97.8 F (36.6 C)  No results for input(s): WBC, CREATININE, LATICACIDVEN, VANCOTROUGH, VANCOPEAK, VANCORANDOM, GENTTROUGH, GENTPEAK, GENTRANDOM, TOBRATROUGH, TOBRAPEAK, TOBRARND, AMIKACINPEAK, AMIKACINTROU, AMIKACIN in the last 168 hours.  Estimated Creatinine Clearance: 100.8 mL/min (by C-G formula based on SCr of 0.84 mg/dL).    No Known Allergies  Thank you for allowing pharmacy to be a part of this patient's care.  Pernell Dupre, PharmD, BCPS Clinical Pharmacist 07/31/2018 6:31 AM

## 2018-07-31 NOTE — Transfer of Care (Signed)
Immediate Anesthesia Transfer of Care Note  Patient: Trevor Zimmerman  Procedure(s) Performed: LUMBAR LAMINECTOMY/DECOMPRESSION MICRODISCECTOMY 1 LEVEL LEFT L5-S1 HEMILAMINETOMY AND DISCETOMY (Left Back)  Patient Location: PACU  Anesthesia Type:General  Level of Consciousness: sedated  Airway & Oxygen Therapy: Patient Spontanous Breathing  Post-op Assessment: Report given to RN  Post vital signs: stable  Last Vitals:  Vitals Value Taken Time  BP 107/71 07/31/18 0931  Temp 36 C 07/31/18 0929  Pulse 58 07/31/18 0938  Resp 16 07/31/18 0938  SpO2 100 % 07/31/18 0938  Vitals shown include unvalidated device data.  Last Pain:  Vitals:   07/31/18 0929  TempSrc:   PainSc: 0-No pain         Complications: No apparent anesthesia complications

## 2018-07-31 NOTE — Discharge Instructions (Addendum)
aAMBULATORY SURGERY  DISCHARGE INSTRUCTIONS   1) The drugs that you were given will stay in your system until tomorrow so for the next 24 hours you should not:  A) Drive an automobile B) Make any legal decisions C) Drink any alcoholic beverage   2) You may resume regular meals tomorrow.  Today it is better to start with liquids and gradually work up to solid foods.  You may eat anything you prefer, but it is better to start with liquids, then soup and crackers, and gradually work up to solid foods.   3) Please notify your doctor immediately if you have any unusual bleeding, trouble breathing, redness and pain at the surgery site, drainage, fever, or pain not relieved by medication.    4) Additional Instructions:        Please contact your physician with any problems or Same Day Surgery at [phone], Monday through Friday 6 am to 4 pm, or [place] [facility] number at [phone]. Your surgeon has performed an operation on your lumbar spine (low back) to relieve pressure on one or more nerves. Many times, patients feel better immediately after surgery and can overdo it. Even if you feel well, it is important that you follow these activity guidelines. If you do not let your back heal properly from the surgery, you can increase the chance of a disc herniation and/or return of your symptoms. The following are instructions to help in your recovery once you have been discharged from the hospital.  * Do not take anti-inflammatory medications for 3 days after surgery (naproxen [Aleve], ibuprofen [Advil, Motrin], celecoxib [Celebrex], etc.)  Activity    No bending, lifting, or twisting (BLT). Avoid lifting objects heavier than 10 pounds (gallon milk jug).  Where possible, avoid household activities that involve lifting, bending, pushing, or pulling such as laundry, vacuuming, grocery shopping, and childcare. Try to arrange for help from friends and family for these  activities while your back heals.  Increase physical activity slowly as tolerated.  Taking short walks is encouraged, but avoid strenuous exercise. Do not jog, run, bicycle, lift weights, or participate in any other exercises unless specifically allowed by your doctor. Avoid prolonged sitting, including car rides.  Talk to your doctor before resuming sexual activity.  You should not drive until cleared by your doctor.  Until released by your doctor, you should not return to work or school.  You should rest at home and let your body heal.   You may shower two days after your surgery.  After showering, lightly dab your incision dry. Do not take a tub bath or go swimming for 3 weeks, or until approved by your doctor at your follow-up appointment.  If you smoke, we strongly recommend that you quit.  Smoking has been proven to interfere with normal healing in your back and will dramatically reduce the success rate of your surgery. Please contact [facility] (800-QUIT-NOW) and use the resources at www.[facility].com for assistance in stopping smoking.  Surgical Incision   If you have a dressing on your incision, you may remove it three days after your surgery. Keep your incision area clean and dry.  If you have staples or stitches on your incision, you should have a follow up scheduled for removal. If you do not have staples or stitches, you will have steri-strips (small pieces of surgical tape) or Dermabond glue. The steri-strips/glue should begin to peel away within about a week (it is fine if the steri-strips fall off before  then). If the strips are still in place one week after your surgery, you may gently remove them.  Diet            You may return to your usual diet. Be sure to stay hydrated.  When to Contact us  Although your surgery and recovery will likely be uneventful, you may have some residual numbness, aches, and pains in your back and/or legs. This is normal and should improve in  the next few weeks.  However, should you experience any of the following, contact us immediately:  New numbness or weakness  Pain that is progressively getting worse, and is not relieved by your pain medications or rest  Bleeding, redness, swelling, pain, or drainage from surgical incision  Chills or flu-like symptoms  Fever greater than 101.0 F (38.3 C)  Problems with bowel or bladder functions  Difficulty breathing or shortness of breath  Warmth, tenderness, or swelling in your calf  Contact Information  During office hours (Monday-Friday 9 am to 5 pm), please call your physician at [phone]  After hours and weekends, please call the [facility] at [phone] and ask for the Neurosurgery Resident On Call   For a life-threatening emergency, call 911

## 2018-07-31 NOTE — Anesthesia Post-op Follow-up Note (Signed)
Anesthesia QCDR form completed.        

## 2021-02-14 IMAGING — CR CHEST - 2 VIEW
1 series · 3 of 3 positions shown · non-contrast
Comparison: 01/28/2016

CLINICAL DATA: Preop evaluation for upcoming lumbar surgery

EXAM:
CHEST - 2 VIEW

[Series 1: dg chest 2 view · 0.14mm/px · 3 of 3 slices shown]
[im 1/3]
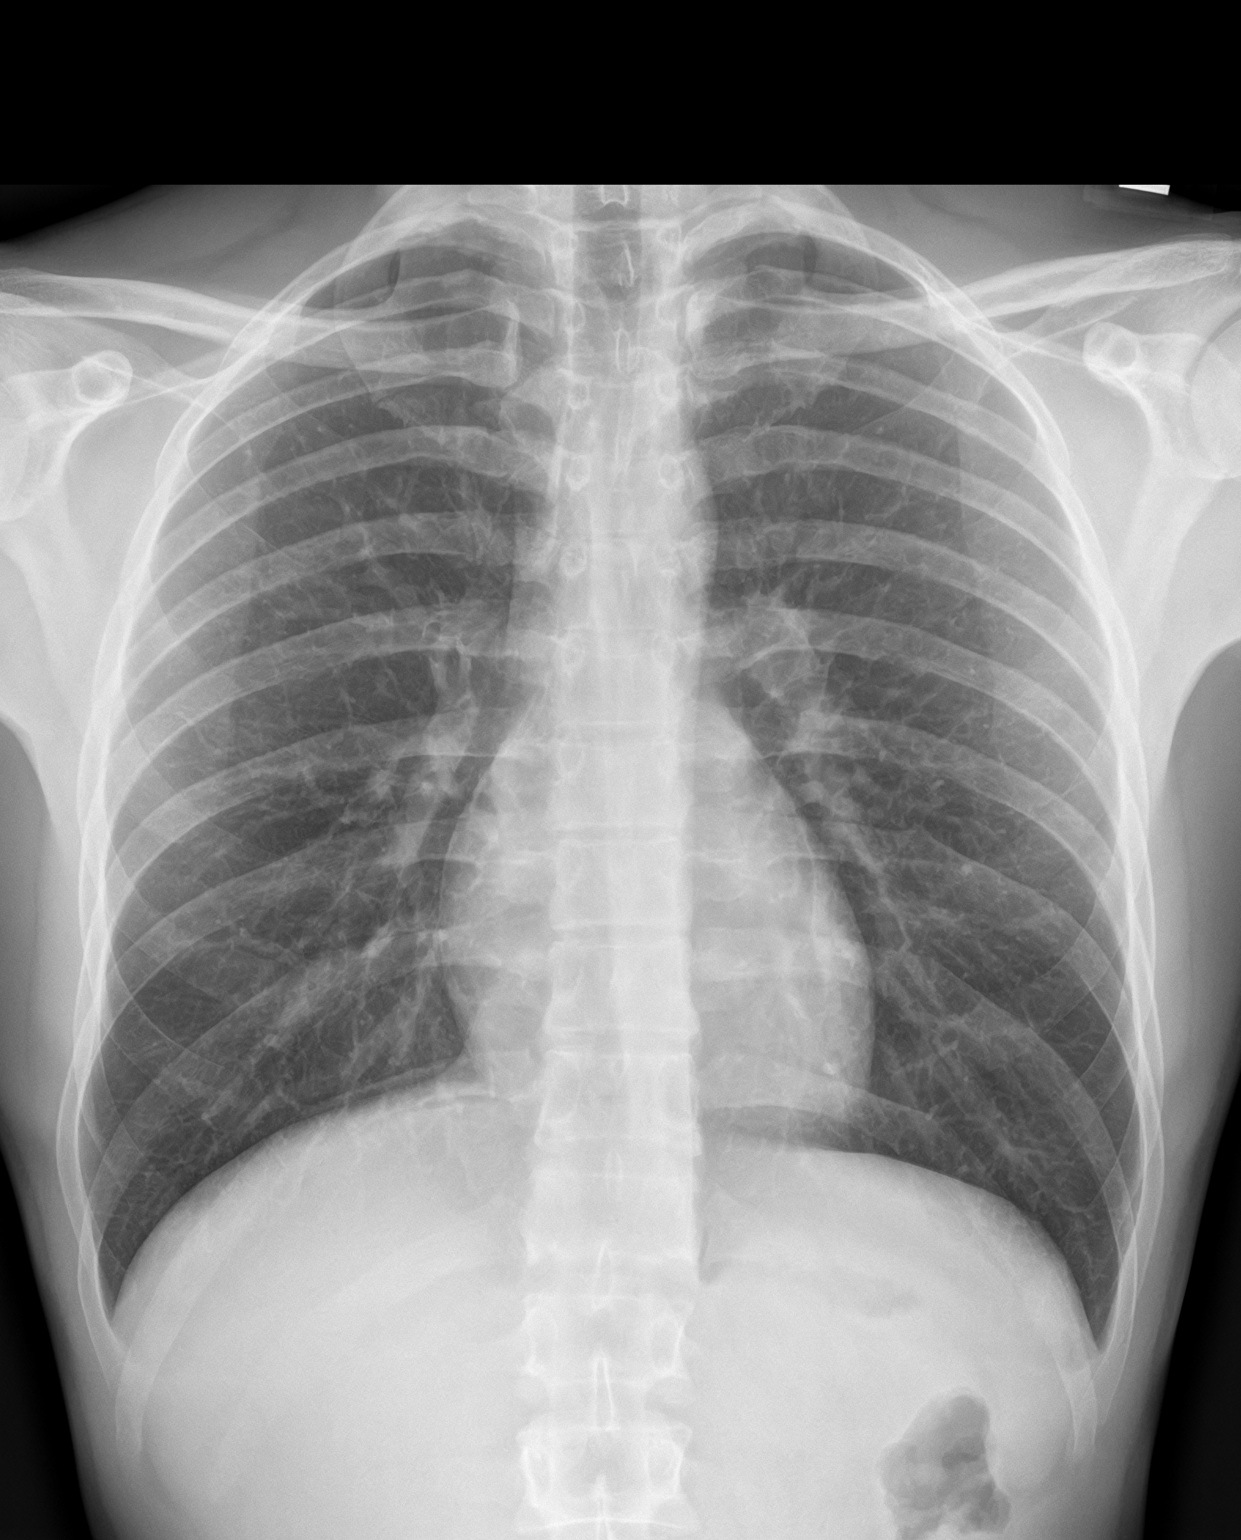
[im 2/3]
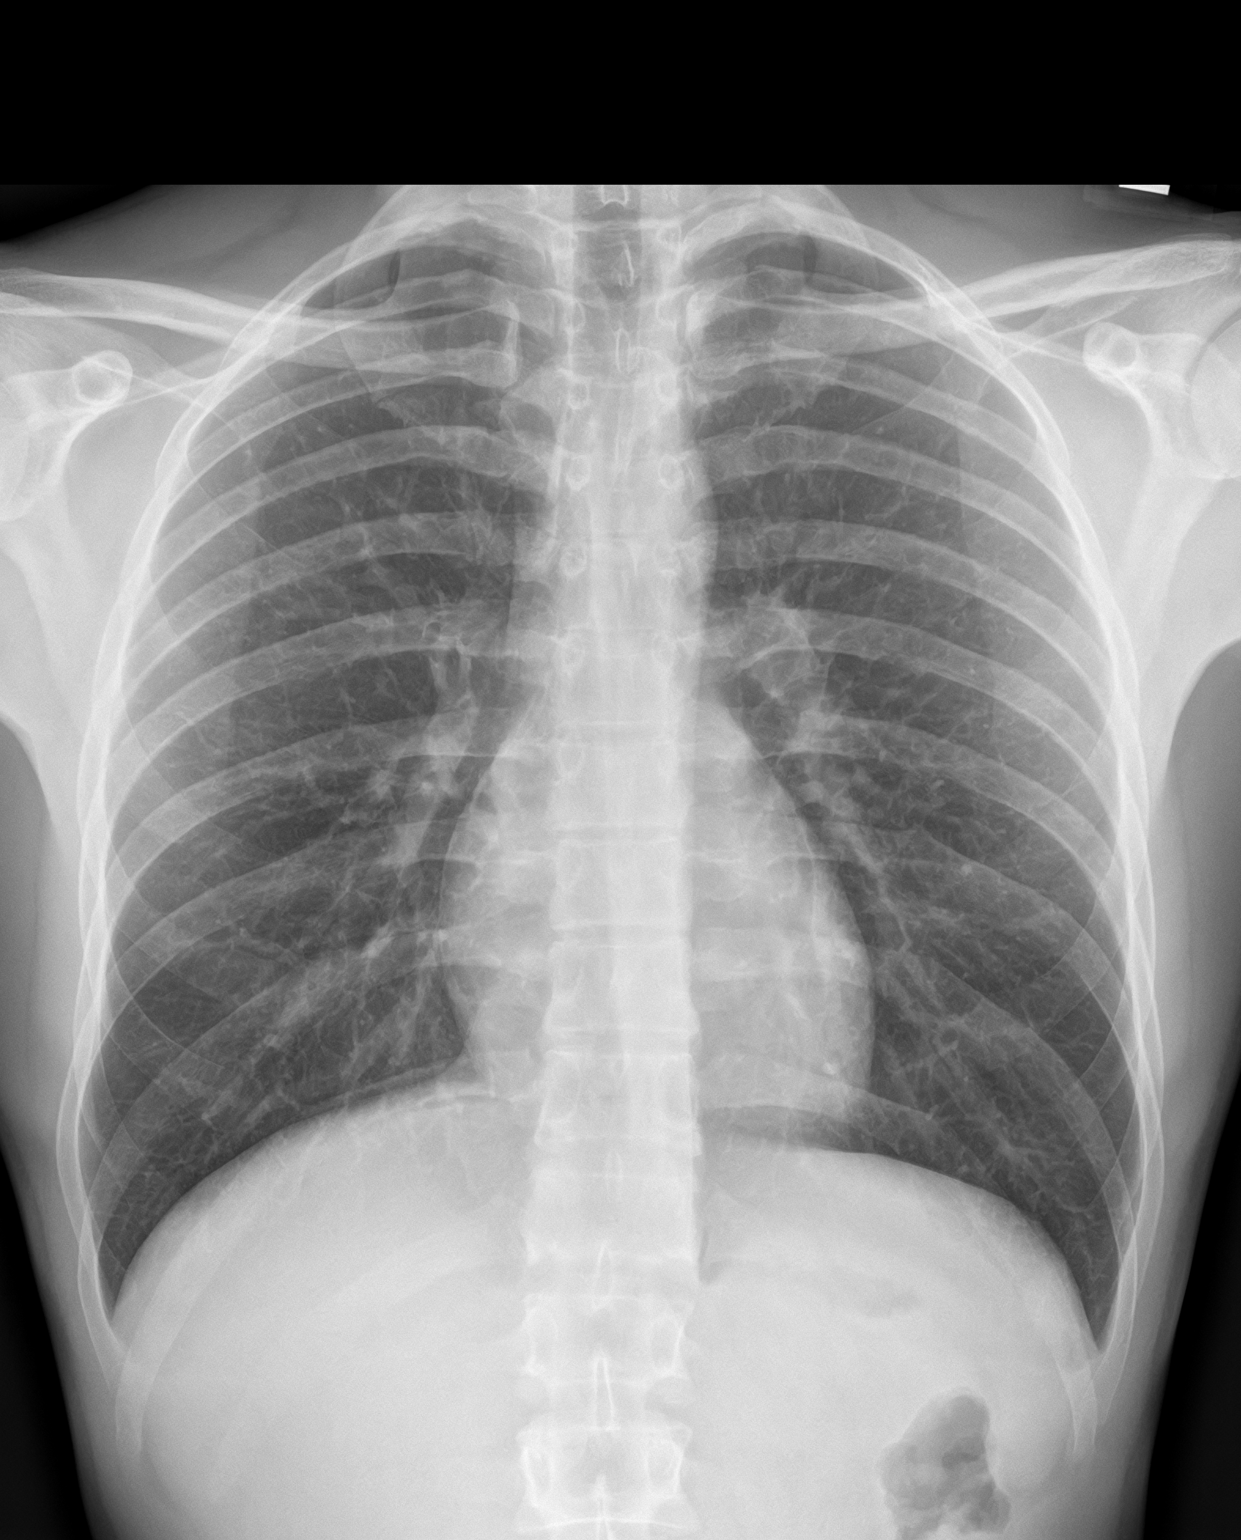
[im 3/3]
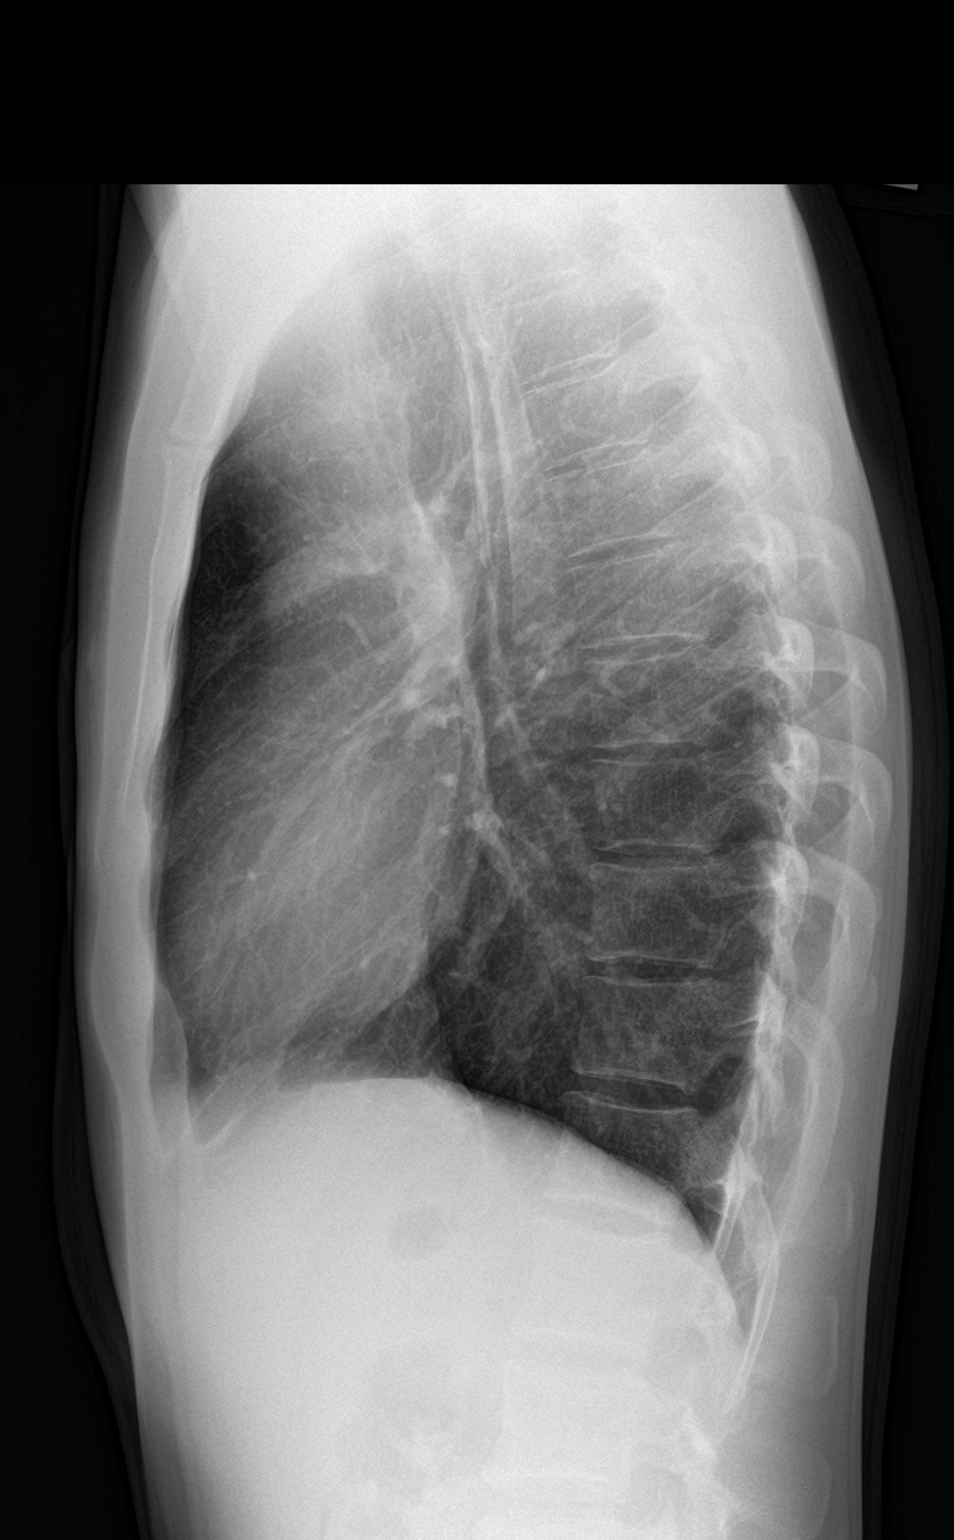

[3 of 3 positions shown; findings below may reference images not displayed]

FINDINGS: The heart size and mediastinal contours are within normal limits.
Both lungs are clear. The visualized skeletal structures are
unremarkable.
IMPRESSION: No active cardiopulmonary disease.

## 2021-02-22 IMAGING — RF LUMBAR SPINE - 1 VIEW
1 series · 2 of 2 positions shown · non-contrast
Comparison: Lumbar spine MRI 06/17/2018

CLINICAL DATA: Lumbar laminectomy.

EXAM:
LUMBAR SPINE - 1 VIEW; DG C-ARM 61-120 MIN

[Series 1: unknown protocol · 0.14mm/px · 2 of 2 slices shown]
[im 1/2]
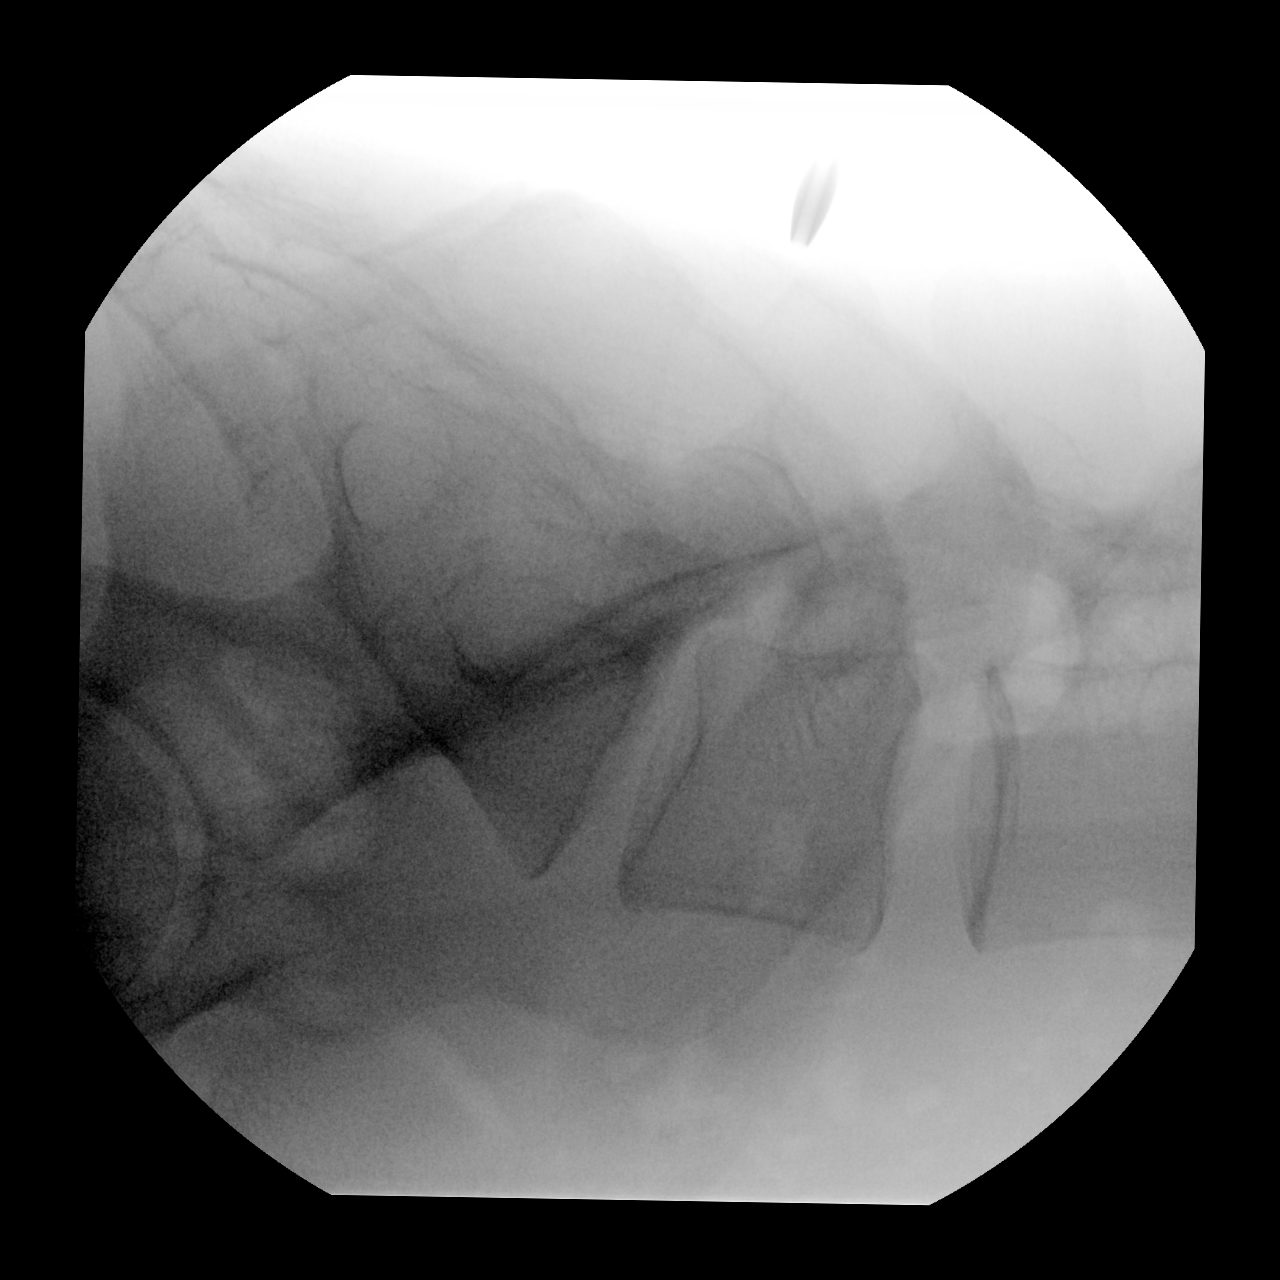
[im 2/2]
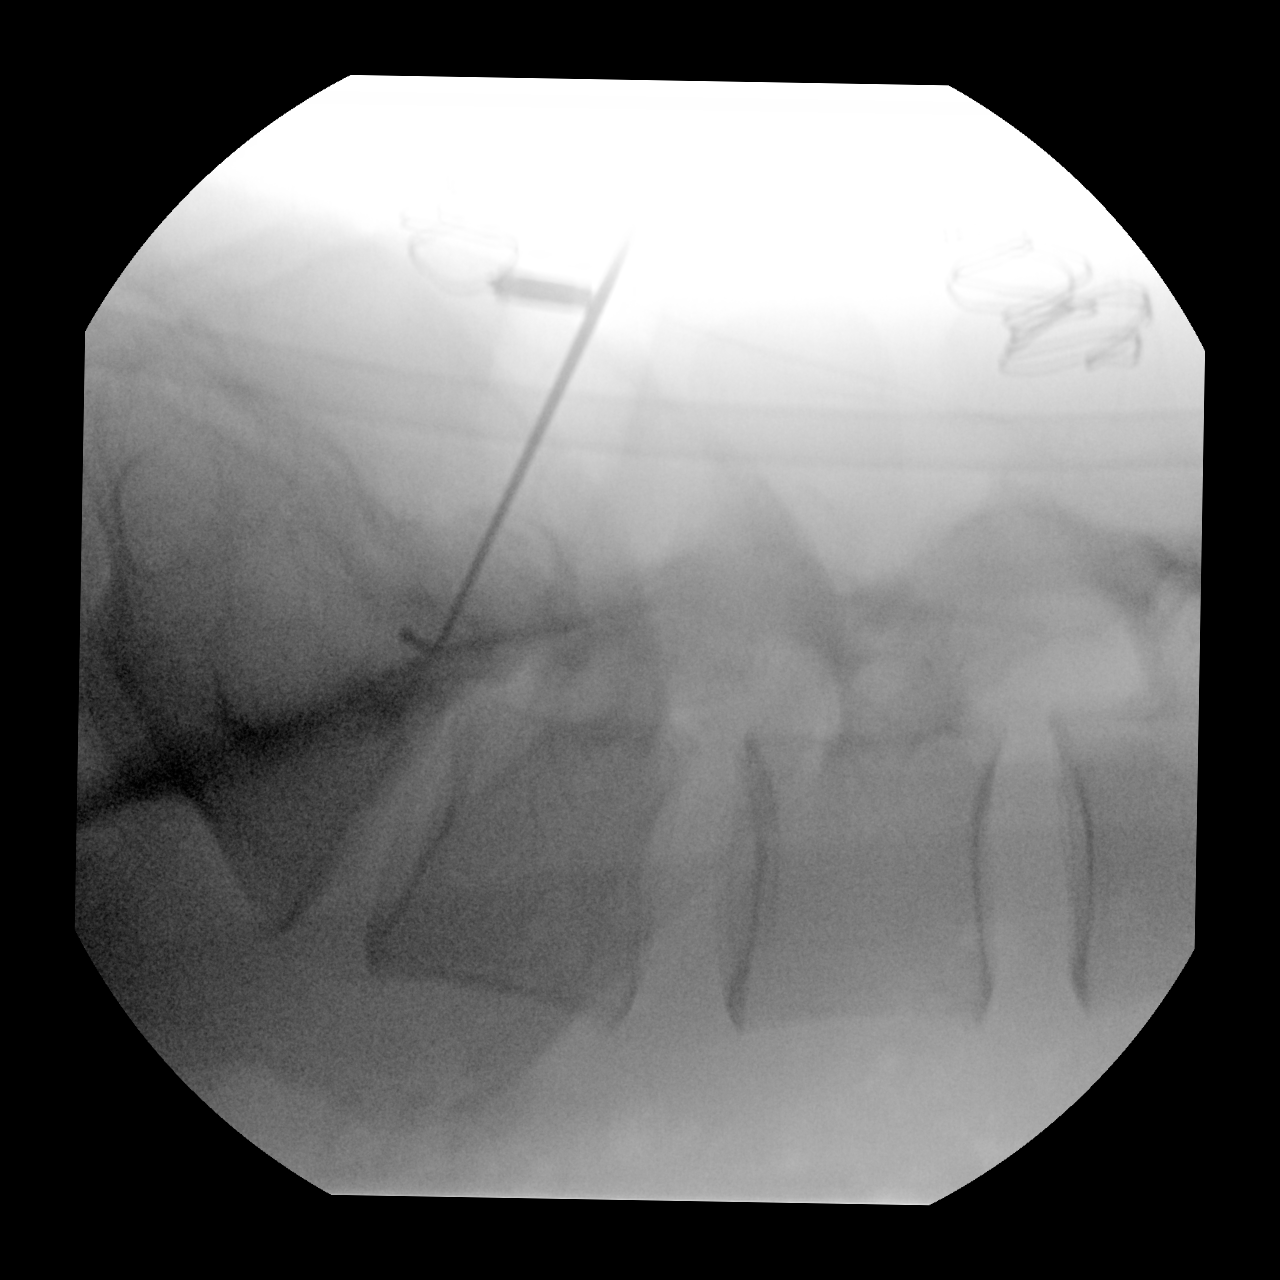

[2 of 2 positions shown; findings below may reference images not displayed]

FLUOROSCOPY TIME:  Fluoroscopy Time: 12 seconds

Radiation Exposure Index: 4.57 mGy
FINDINGS: Two intraoperative fluoroscopic images of the lower lumbar spine in
the lateral projection are provided. The first demonstrates the tip
of a metallic instrument projecting over the skin surface at the
L5-S1 level. The second demonstrates the tip of a surgical probe
just posterior to the L5-S1 disc space.
IMPRESSION: Intraoperative images during lumbar spine surgery as above.

## 2021-05-10 ENCOUNTER — Other Ambulatory Visit: Payer: Self-pay

## 2021-05-10 ENCOUNTER — Emergency Department
Admission: EM | Admit: 2021-05-10 | Discharge: 2021-05-10 | Disposition: A | Discharge status: Home / Self Care | Payer: Medicaid Other | Attending: Emergency Medicine | Admitting: Emergency Medicine

## 2021-05-10 ENCOUNTER — Encounter: Payer: Self-pay | Admitting: Intensive Care

## 2021-05-10 ENCOUNTER — Emergency Department: Payer: Medicaid Other

## 2021-05-10 DIAGNOSIS — N200 Calculus of kidney: Secondary | ICD-10-CM

## 2021-05-10 DIAGNOSIS — N132 Hydronephrosis with renal and ureteral calculous obstruction: Secondary | ICD-10-CM | POA: Insufficient documentation

## 2021-05-10 DIAGNOSIS — N201 Calculus of ureter: Secondary | ICD-10-CM

## 2021-05-10 DIAGNOSIS — R109 Unspecified abdominal pain: Secondary | ICD-10-CM

## 2021-05-10 LAB — BASIC METABOLIC PANEL
Anion gap: 7 (ref 5–15)
BUN: 23 mg/dL — ABNORMAL HIGH (ref 6–20)
CO2: 27 mmol/L (ref 22–32)
Calcium: 9.5 mg/dL (ref 8.9–10.3)
Chloride: 105 mmol/L (ref 98–111)
Creatinine, Ser: 1.04 mg/dL (ref 0.61–1.24)
GFR, Estimated: >60 mL/min (ref >60)
Glucose, Bld: 134 mg/dL — ABNORMAL HIGH (ref 70–99)
Potassium: 3.9 mmol/L (ref 3.5–5.1)
Sodium: 139 mmol/L (ref 135–145)

## 2021-05-10 LAB — CBC
HCT: 39.7 % (ref 39.0–52.0)
Hemoglobin: 13.3 g/dL (ref 13.0–17.0)
MCH: 30.3 pg (ref 26.0–34.0)
MCHC: 33.5 g/dL (ref 30.0–36.0)
MCV: 90.4 fL (ref 80.0–100.0)
Platelets: 279 10*3/uL (ref 150–400)
RBC: 4.39 MIL/uL (ref 4.22–5.81)
RDW: 12.3 % (ref 11.5–15.5)
WBC: 11.9 10*3/uL — ABNORMAL HIGH (ref 4.0–10.5)
nRBC: 0 % (ref 0.0–0.2)

## 2021-05-10 LAB — URINALYSIS, ROUTINE W REFLEX MICROSCOPIC
Bilirubin Urine: NEGATIVE
Glucose, UA: NEGATIVE mg/dL
Ketones, ur: 5 mg/dL — AB
Leukocytes,Ua: NEGATIVE
Nitrite: NEGATIVE
Protein, ur: NEGATIVE mg/dL
Specific Gravity, Urine: 1.03 (ref 1.005–1.030)
Squamous Epithelial / HPF: NONE SEEN (ref 0–5)
pH: 5 (ref 5.0–8.0)

## 2021-05-10 MED ORDER — TAMSULOSIN HCL 0.4 MG PO CAPS: 0.4000 mg | ORAL_CAPSULE | Freq: Every day | ORAL | 0 refills | Status: AC

## 2021-05-10 MED ORDER — HYDROCODONE-ACETAMINOPHEN 5-325 MG PO TABS: 1.0000 | ORAL_TABLET | ORAL | 0 refills | Status: AC | PRN

## 2021-05-10 MED ORDER — TAMSULOSIN HCL 0.4 MG PO CAPS
0.4000 mg | ORAL_CAPSULE | Freq: Once | ORAL | Status: AC
  Administered 2021-05-10: 0.4 mg via ORAL
  Filled 2021-05-10: qty 1

## 2021-05-10 MED ORDER — SODIUM CHLORIDE 0.9 % IV BOLUS
1000.0000 mL | Freq: Once | INTRAVENOUS | Status: AC
  Administered 2021-05-10: 1000 mL via INTRAVENOUS

## 2021-05-10 MED ORDER — KETOROLAC TROMETHAMINE 30 MG/ML IJ SOLN
30.0000 mg | Freq: Once | INTRAMUSCULAR | Status: AC
  Administered 2021-05-10: 30 mg via INTRAVENOUS
  Filled 2021-05-10: qty 1

## 2021-05-10 MED ORDER — HYDROCODONE-ACETAMINOPHEN 5-325 MG PO TABS
1.0000 | ORAL_TABLET | Freq: Once | ORAL | Status: AC
  Administered 2021-05-10: 1 via ORAL
  Filled 2021-05-10: qty 1

## 2021-05-10 MED ORDER — ONDANSETRON HCL 4 MG/2ML IJ SOLN
4.0000 mg | Freq: Once | INTRAMUSCULAR | Status: AC
  Administered 2021-05-10: 4 mg via INTRAVENOUS
  Filled 2021-05-10: qty 2

## 2021-05-10 MED ORDER — MORPHINE SULFATE (PF) 4 MG/ML IV SOLN
4.0000 mg | Freq: Once | INTRAVENOUS | Status: AC
  Administered 2021-05-10: 4 mg via INTRAVENOUS
  Filled 2021-05-10: qty 1

## 2021-05-10 MED ORDER — ONDANSETRON 8 MG PO TBDP: 8.0000 mg | ORAL_TABLET | Freq: Three times a day (TID) | ORAL | 0 refills | Status: AC | PRN

## 2021-05-10 NOTE — ED Triage Notes (Signed)
Patient c/o left flank pain. Sent here by UC for possibly kidney stone. Symptoms started this AM. Reports he has not urinated today yet ?

## 2021-05-10 NOTE — ED Provider Notes (Signed)
? ?[facility] ?Provider Note ? ? Event Date/Time  ? First MD Initiated Contact with Patient 05/10/21 [phone]   ?  (approximate) ?History  ?Flank Pain ? ?HPI ?Trevor Zimmerman is a 36 y.o. male with no stated past medical history presents for left-sided flank pain that began overnight last night and has been worsening since onset.  Patient describes 10/10 radiating pain into the groin that is associated with nausea/vomiting.  Patient denies any pain similar to this in the past.  Patient denies any diarrhea/constipation, hematemesis, or weakness/numbness/paresthesias in any extremity. ?Physical Exam  ?Triage Vital Signs: ?ED Triage Vitals  ?Enc Vitals Group  ?   BP 05/10/21 0849 126/81  ?   Pulse Rate 05/10/21 0849 64  ?   Resp 05/10/21 0849 17  ?   Temp 05/10/21 0852 (!) 97 ?F (36.1 ?C)  ?   Temp Source 05/10/21 0852 Axillary  ?   SpO2 05/10/21 0849 100 %  ?   Weight 05/10/21 0850 130 lb (59 kg)  ?   Height 05/10/21 0850 '5\' 5"'$  (1.651 m)  ?   Head Circumference --   ?   Peak Flow --   ?   Pain Score 05/10/21 0849 10  ?   Pain Loc --   ?   Pain Edu? --   ?   Excl. in [place]? --   ? ?Most recent vital signs: ?Vitals:  ? 05/10/21 0849 05/10/21 0852  ?BP: 126/81   ?Pulse: 64   ?Resp: 17   ?Temp:  (!) 97 ?F (36.1 ?C)  ?SpO2: 100%   ? ?General: Awake, oriented x4. ?CV:  Good peripheral perfusion.  ?Resp:  Normal effort.  ?Abd:  No distention.  Left CVA tenderness to percussion ?Other:  Middle-aged Caucasian male laying in bed in no acute distress ?ED Results / Procedures / Treatments  ?Labs ?(all labs ordered are listed, but only abnormal results are displayed) ?Labs Reviewed  ?URINALYSIS, ROUTINE W REFLEX MICROSCOPIC - Abnormal; Notable for the following components:  ?    Result Value  ? Color, Urine YELLOW (*)   ? APPearance CLEAR (*)   ? Hgb urine dipstick SMALL (*)   ? Ketones, ur 5 (*)   ? Bacteria, UA RARE (*)   ? All other components within normal limits  ?BASIC METABOLIC PANEL - Abnormal; Notable  for the following components:  ? Glucose, Bld 134 (*)   ? BUN 23 (*)   ? All other components within normal limits  ?CBC - Abnormal; Notable for the following components:  ? WBC 11.9 (*)   ? All other components within normal limits  ? ?RADIOLOGY ?ED MD interpretation: CT of the abdomen and pelvis without contrast interpreted by me and shows 2 mm calculus at the posterior left urinary bladder near the ureteral orifice with mild left hydroureteronephrosis ?-Agree with radiology assessment ?Official radiology report(s): ?CT Renal Stone Study ? ?Result Date: 05/10/2021 ?CLINICAL DATA:  Left flank pain EXAM: CT ABDOMEN AND PELVIS WITHOUT CONTRAST TECHNIQUE: Multidetector CT imaging of the abdomen and pelvis was performed following the standard protocol without IV contrast. RADIATION DOSE REDUCTION: This exam was performed according to the departmental dose-optimization program which includes automated exposure control, adjustment of the mA and/or kV according to patient size and/or use of iterative reconstruction technique. COMPARISON:  CT abdomen and pelvis 01/16/2016 FINDINGS: Lower chest: No acute abnormality. Hepatobiliary: No focal liver abnormality is seen. No gallstones, gallbladder wall thickening, or biliary dilatation. Pancreas: Unremarkable.  No pancreatic ductal dilatation or surrounding inflammatory changes. Spleen: Normal in size without focal abnormality. Adrenals/Urinary Tract: Adrenal glands appear normal. Right kidney appears normal with no nephrolithiasis or hydronephrosis. Mild left hydroureteronephrosis. No nephrolithiasis on the left. There is a 2 mm calculus in the posterior left urinary bladder near the ureteral orifice. Stomach/Bowel: No bowel obstruction, free air or pneumatosis. No bowel wall edema identified. Vascular/Lymphatic: No significant vascular findings are present. No enlarged abdominal or pelvic lymph nodes. Reproductive: Prostate is unremarkable. Other: No abdominal wall hernia or  abnormality. No abdominopelvic ascites. Musculoskeletal: No acute or significant osseous findings. IMPRESSION: 2 mm calculus at the posterior left urinary bladder near the ureteral orifice with mild left hydroureteronephrosis. Electronically Signed   By: Ofilia Neas M.D.   On: 05/10/2021 09:40   ?PROCEDURES: ?Critical Care performed: No ?Procedures ?MEDICATIONS ORDERED IN ED: ?Medications  ?ondansetron (ZOFRAN) injection 4 mg (4 mg Intravenous Given 05/10/21 0924)  ?sodium chloride 0.9 % bolus 1,000 mL (1,000 mLs Intravenous New Bag/Given 05/10/21 0924)  ?ketorolac (TORADOL) 30 MG/ML injection 30 mg (30 mg Intravenous Given 05/10/21 0924)  ?morphine (PF) 4 MG/ML injection 4 mg (4 mg Intravenous Given 05/10/21 0923)  ? ?IMPRESSION / MDM / ASSESSMENT AND PLAN / ED COURSE  ?I reviewed the triage vital signs and the nursing notes. ?             ?               ?The patient is on the cardiac monitor to evaluate for evidence of arrhythmia and/or significant heart rate changes. ?Patient presents for severe flank pain. ?Presentation most consistent with Renal Colic from a Non-infected Kidney Stone. ?Given History and Exam I have lower suspicion for atypical appendicitis, genital torsion, acute cholecystitis, AAA, Aortic Dissection, Serious Bacterial Illness or other emergent intraabdominal pathology. ? ?Workup: CBC, BMP, CT Abd/Pelvis noncontrast, UA, reassess ?Findings: ?2 mm left kidney stone with mild hydroureteronephrosis ?Reassesment: Patient tolerating PO and pain controlled ?Rx: Norco, Flomax, Zofran ?Disposition:  Discharge. Strict return precautions for infected stone or PO intolerance discussed.  Follow-up with urology as needed ? ?  ?FINAL CLINICAL IMPRESSION(S) / ED DIAGNOSES  ? ?Final diagnoses:  ?Left flank pain  ?Kidney stone  ?Ureterolithiasis  ? ?Rx / DC Orders  ? ?ED Discharge Orders   ? ?      Ordered  ?  HYDROcodone-acetaminophen (NORCO) 5-325 MG tablet  Every 4 hours PRN       ? 05/10/21 1008  ?   tamsulosin (FLOMAX) 0.4 MG CAPS capsule  Daily       ? 05/10/21 1008  ?  ondansetron (ZOFRAN-ODT) 8 MG disintegrating tablet  Every 8 hours PRN       ? 05/10/21 1008  ? ?  ?  ? ?  ? ?Note:  This document was prepared using Dragon voice recognition software and may include unintentional dictation errors. ?  ?Naaman Plummer, MD ?05/10/21 1043 ? ?

## 2021-05-10 NOTE — ED Notes (Signed)
Dc instructions and scripts reviewed with pt no question or concerns at this time. Will follow up withurology  ?

## 2023-05-31 ENCOUNTER — Ambulatory Visit
Admission: EM | Admit: 2023-05-31 | Discharge: 2023-05-31 | Disposition: A | Discharge status: Home / Self Care | Payer: Self-pay | Attending: Emergency Medicine | Admitting: Emergency Medicine

## 2023-05-31 ENCOUNTER — Encounter: Payer: Self-pay | Admitting: Emergency Medicine

## 2023-05-31 ENCOUNTER — Other Ambulatory Visit: Payer: Self-pay

## 2023-05-31 DIAGNOSIS — J069 Acute upper respiratory infection, unspecified: Secondary | ICD-10-CM

## 2023-05-31 MED ORDER — PROMETHAZINE-DM 6.25-15 MG/5ML PO SYRP: 5.0000 mL | ORAL_SOLUTION | Freq: Every evening | ORAL | 0 refills | Status: AC | PRN

## 2023-05-31 MED ORDER — BENZONATATE 100 MG PO CAPS: 100.0000 mg | ORAL_CAPSULE | Freq: Three times a day (TID) | ORAL | 0 refills | Status: AC

## 2023-05-31 MED ORDER — PREDNISONE 10 MG (21) PO TBPK: ORAL_TABLET | Freq: Every day | ORAL | 0 refills | Status: DC

## 2023-05-31 MED ORDER — KETOROLAC TROMETHAMINE 30 MG/ML IJ SOLN
30.0000 mg | Freq: Once | INTRAMUSCULAR | Status: AC
  Administered 2023-05-31: 30 mg via INTRAMUSCULAR

## 2023-05-31 NOTE — ED Triage Notes (Signed)
 Patient preents to [facility] for evaluation of headache, sore throat, runny nose, and feeling weak and fatigued since Tuesday.  Excedrin helps for a little bit.

## 2023-05-31 NOTE — Discharge Instructions (Addendum)
 Your symptoms today are most likely being caused by a virus and should steadily improve in time it can take up to 7 to 10 days before you truly start to see a turnaround however things will get better  Given injection of Toradol to help with your headache and ideally will see relief an hour  Start tomorrow take prednisone every morning with food to open and relax the airway, should help with cough, shortness of breath and wheezing  May use Tessalon pill every 8 hours as needed to help calm your cough  May use cough syrup at bedtime for additional comfort    You can take Tylenol and/or Ibuprofen as needed for fever reduction and pain relief.   For cough: honey 1/2 to 1 teaspoon (you can dilute the honey in water or another fluid).  You can also use guaifenesin and dextromethorphan for cough. You can use a humidifier for chest congestion and cough.  If you don't have a humidifier, you can sit in the bathroom with the hot shower running.      For sore throat: try warm salt water gargles, cepacol lozenges, throat spray, warm tea or water with lemon/honey, popsicles or ice, or OTC cold relief medicine for throat discomfort.   For congestion: take a daily anti-histamine like Zyrtec, Claritin, and a oral decongestant, such as pseudoephedrine.  You can also use Flonase 1-2 sprays in each nostril daily.   It is important to stay hydrated: drink plenty of fluids (water, gatorade/powerade/pedialyte, juices, or teas) to keep your throat moisturized and help further relieve irritation/discomfort.

## 2023-06-01 NOTE — ED Provider Notes (Signed)
 Arlander Bellman    CSN: [id number] Arrival date & time: 05/31/23  1516      History   Chief Complaint Chief Complaint  Patient presents with   URI    HPI Trevor Zimmerman is a 38 y.o. male.   Patient presents for evaluation of increased fatigue, general malaise, constant generalized headaches, nasal congestion, rhinorrhea, sore throat, productive cough present for 4 days.  Worsening shortness of breath and wheezing with cough.  History of COPD.  Eating food and liquids.  Known sick contact prior.  Has attempted some over-the-counter medication with minimal relief.  Past Medical History:  Diagnosis Date   Cancer [facility])     Patient Active Problem List   Diagnosis Date Noted   Leukocytosis 01/29/2016   Hyponatremia 01/29/2016   Tobacco abuse counseling 01/29/2016   Fluid overload 01/29/2016   COPD exacerbation (HCC) 01/29/2016   Multifocal pneumonia 01/27/2016   Persistent cough 01/27/2016   Pleuritic chest pain 01/27/2016   SOB (shortness of breath) 01/27/2016   Community acquired pneumonia of left lower lobe of lung    Generalized abdominal pain     Past Surgical History:  Procedure Laterality Date   LUMBAR LAMINECTOMY/DECOMPRESSION MICRODISCECTOMY Left 07/31/2018   Procedure: LUMBAR LAMINECTOMY/DECOMPRESSION MICRODISCECTOMY 1 LEVEL LEFT L5-S1 HEMILAMINETOMY AND DISCETOMY;  Surgeon: Berta Brittle, MD;  Location: [facility] ORS;  Service: Neurosurgery;  Laterality: Left;   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)         Home Medications    Prior to Admission medications   Medication Sig Start Date End Date Taking? Authorizing Provider  benzonatate (TESSALON) 100 MG capsule Take 1 capsule (100 mg total) by mouth every 8 (eight) hours. 05/31/23  Yes Armanda Forand R, NP  predniSONE (STERAPRED UNI-PAK 21 TAB) 10 MG (21) TBPK tablet Take by mouth daily. Take 6 tabs by mouth daily  for 1 days, then 5 tabs for 1 days, then 4 tabs for 1 days, then 3 tabs for 1 days, 2 tabs for  1 days, then 1 tab by mouth daily for 1 days 05/31/23  Yes Virgel Haro R, NP  promethazine-dextromethorphan (PROMETHAZINE-DM) 6.25-15 MG/5ML syrup Take 5 mLs by mouth at bedtime as needed. 05/31/23  Yes Andjela Wickes R, NP  gabapentin (NEURONTIN) 300 MG capsule Take 600 mg by mouth 2 (two) times daily.     [provider]  methocarbamol (ROBAXIN) 500 MG tablet Take 1 tablet (500 mg total) by mouth every 6 (six) hours as needed for muscle spasms. 07/31/18   Guadelupe Leech, PA-C  ondansetron (ZOFRAN-ODT) 8 MG disintegrating tablet Take 1 tablet (8 mg total) by mouth every 8 (eight) hours as needed for nausea or vomiting. 05/10/21   Bradler, Evan K, MD    Family History History reviewed. No pertinent family history.  Social History Social History   Tobacco Use   Smoking status: Every Day    Current packs/day: 1.00    Types: Cigarettes   Smokeless tobacco: Never  Vaping Use   Vaping status: Never Used  Substance Use Topics   Alcohol use: No   Drug use: No     Allergies   Patient has no known allergies.   Review of Systems Review of Systems   Physical Exam Triage Vital Signs ED Triage Vitals  Encounter Vitals Group     BP 05/31/23 1532 119/77     Systolic BP Percentile --      Diastolic BP Percentile --      Pulse Rate 05/31/23  1532 79     Resp 05/31/23 1532 18     Temp 05/31/23 1532 100 F (37.8 C)     Temp Source 05/31/23 1532 Oral     SpO2 05/31/23 1532 94 %     Weight --      Height --      Head Circumference --      Peak Flow --      Pain Score 05/31/23 1538 5     Pain Loc --      Pain Education --      Exclude from Growth Chart --    No data found.  Updated Vital Signs BP 119/77 (BP Location: Left Arm)   Pulse 79   Temp 100 F (37.8 C) (Oral)   Resp 18   SpO2 94%   Visual Acuity Right Eye Distance:   Left Eye Distance:   Bilateral Distance:    Right Eye Near:   Left Eye Near:    Bilateral Near:     Physical Exam Constitutional:       Appearance: Normal appearance.  HENT:     Head: Normocephalic.     Right Ear: Tympanic membrane, ear canal and external ear normal.     Left Ear: Tympanic membrane, ear canal and external ear normal.     Nose: Congestion present.     Mouth/Throat:     Mouth: Mucous membranes are moist.     Pharynx: Oropharynx is clear. Posterior oropharyngeal erythema present. No oropharyngeal exudate.  Eyes:     Extraocular Movements: Extraocular movements intact.  Cardiovascular:     Rate and Rhythm: Normal rate and regular rhythm.     Pulses: Normal pulses.     Heart sounds: Normal heart sounds.  Pulmonary:     Effort: Pulmonary effort is normal.     Breath sounds: Normal breath sounds.  Musculoskeletal:     Cervical back: Normal range of motion and neck supple.  Neurological:     Mental Status: He is alert and oriented to person, place, and time. Mental status is at baseline.      UC Treatments / Results  Labs (all labs ordered are listed, but only abnormal results are displayed) Labs Reviewed - No data to display  EKG   Radiology No results found.  Procedures Procedures (including critical care time)  Medications Ordered in UC Medications  ketorolac (TORADOL) 30 MG/ML injection 30 mg (30 mg Intramuscular Given 05/31/23 1607)    Initial Impression / Assessment and Plan / UC Course  I have reviewed the triage vital signs and the nursing notes.  Pertinent labs & imaging results that were available during my care of the patient were reviewed by me and considered in my medical decision making (see chart for details).  Viral URI with cough  Patient is in no signs of distress nor toxic appearing.  Vital signs are stable.  Low suspicion for pneumonia, pneumothorax or bronchitis and therefore will defer imaging.  Significant other with same symptoms being assessed as well, negative for COVID flu and strep, most likely have same illness with the etiology being viral.  Prescribed  prednisone, Tessalon and Promethazine DM.  Toradol IM given for management of headache.May use additional over-the-counter medications as needed for supportive care.  May follow-up with urgent care as needed if symptoms persist or worsen.   Final Clinical Impressions(s) / UC Diagnoses   Final diagnoses:  Viral URI with cough     Discharge Instructions  Your symptoms today are most likely being caused by a virus and should steadily improve in time it can take up to 7 to 10 days before you truly start to see a turnaround however things will get better  Given injection of Toradol to help with your headache and ideally will see relief an hour  Start tomorrow take prednisone every morning with food to open and relax the airway, should help with cough, shortness of breath and wheezing  May use Tessalon pill every 8 hours as needed to help calm your cough  May use cough syrup at bedtime for additional comfort    You can take Tylenol and/or Ibuprofen as needed for fever reduction and pain relief.   For cough: honey 1/2 to 1 teaspoon (you can dilute the honey in water or another fluid).  You can also use guaifenesin and dextromethorphan for cough. You can use a humidifier for chest congestion and cough.  If you don't have a humidifier, you can sit in the bathroom with the hot shower running.      For sore throat: try warm salt water gargles, cepacol lozenges, throat spray, warm tea or water with lemon/honey, popsicles or ice, or OTC cold relief medicine for throat discomfort.   For congestion: take a daily anti-histamine like Zyrtec, Claritin, and a oral decongestant, such as pseudoephedrine.  You can also use Flonase 1-2 sprays in each nostril daily.   It is important to stay hydrated: drink plenty of fluids (water, gatorade/powerade/pedialyte, juices, or teas) to keep your throat moisturized and help further relieve irritation/discomfort.    ED Prescriptions     Medication Sig  Dispense Auth. Provider   predniSONE (STERAPRED UNI-PAK 21 TAB) 10 MG (21) TBPK tablet Take by mouth daily. Take 6 tabs by mouth daily  for 1 days, then 5 tabs for 1 days, then 4 tabs for 1 days, then 3 tabs for 1 days, 2 tabs for 1 days, then 1 tab by mouth daily for 1 days 21 tablet Moshe Wenger R, NP   benzonatate (TESSALON) 100 MG capsule Take 1 capsule (100 mg total) by mouth every 8 (eight) hours. 21 capsule Keiandra Sullenger R, NP   promethazine-dextromethorphan (PROMETHAZINE-DM) 6.25-15 MG/5ML syrup Take 5 mLs by mouth at bedtime as needed. 118 mL Katiana Ruland, Maybelle Spatz, NP      PDMP not reviewed this encounter.   Reena Canning, [place] 06/01/23 [phone]

## 2023-06-07 ENCOUNTER — Telehealth: Payer: Self-pay | Admitting: Nurse Practitioner

## 2023-06-07 DIAGNOSIS — J4520 Mild intermittent asthma, uncomplicated: Secondary | ICD-10-CM

## 2023-06-07 MED ORDER — PREDNISONE 20 MG PO TABS: 40.0000 mg | ORAL_TABLET | Freq: Every day | ORAL | 0 refills | Status: AC

## 2023-06-07 NOTE — Progress Notes (Signed)
 Virtual Visit Consent   [name], you are scheduled for a virtual visit with a Trevor Zimmerman today. Just as with appointments in the office, your consent must be obtained to participate. Your consent will be active for this visit and any virtual visit you may have with one of our providers in the next 365 days. If you have a MyChart account, a copy of this consent can be sent to you electronically.  As this is a virtual visit, video technology does not allow for your Zimmerman to perform a traditional examination. This may limit your Zimmerman's ability to fully assess your condition. If your Zimmerman identifies any concerns that need to be evaluated in person or the need to arrange testing (such as labs, EKG, etc.), we will make arrangements to do so. Although advances in technology are sophisticated, we cannot ensure that it will always work on either your end or our end. If the connection with a video visit is poor, the visit may have to be switched to a telephone visit. With either a video or telephone visit, we are not always able to ensure that we have a secure connection.  By engaging in this virtual visit, you consent to the provision of healthcare and authorize for your insurance to be billed (if applicable) for the services provided during this visit. Depending on your insurance coverage, you may receive a charge related to this service.  I need to obtain your verbal consent now. Are you willing to proceed with your visit today? HABIB KISE has provided verbal consent on 06/07/2023 for a virtual visit (video or telephone). Collins Dean, NP  Date: 06/07/2023 4:25 PM   Virtual Visit via Video Note   I, Collins Dean, connected with  Trevor Zimmerman  ([id number], [birth year]) on 06/07/23 at  4:30 PM EDT by a video-enabled telemedicine application and verified that I am speaking with the correct person using two identifiers.  Location: Patient: Virtual Visit  Location Patient: Home Zimmerman: Virtual Visit Location Zimmerman: Home Office   I discussed the limitations of evaluation and management by telemedicine and the availability of in person appointments. The patient expressed understanding and agreed to proceed.    History of Present Illness: Trevor Zimmerman is a 38 y.o. who identifies as a male who was assigned male at birth, and is being seen today for viral uri with cough and congestion .  Mr. Gobert was treated for Viral URi with cough on 05-31-2023 with tessalon , prednisone , and promethazine . He has recently completed treatment and still experiencing dry hacking cough, shortness of breath and chest tightness. He does have a former history of smoking and COPD.    Problems:  Patient Active Problem List   Diagnosis Date Noted   Leukocytosis 01/29/2016   Hyponatremia 01/29/2016   Tobacco abuse counseling 01/29/2016   Fluid overload 01/29/2016   COPD exacerbation (HCC) 01/29/2016   Multifocal pneumonia 01/27/2016   Persistent cough 01/27/2016   Pleuritic chest pain 01/27/2016   SOB (shortness of breath) 01/27/2016   Community acquired pneumonia of left lower lobe of lung    Generalized abdominal pain     Allergies: No Known Allergies Medications:  Current Outpatient Medications:    predniSONE  (DELTASONE ) 20 MG tablet, Take 2 tablets (40 mg total) by mouth daily with breakfast for 5 days., Disp: 10 tablet, Rfl: 0   benzonatate  (TESSALON ) 100 MG capsule, Take 1 capsule (100 mg total) by mouth every 8 (eight) hours., Disp: 21  capsule, Rfl: 0   gabapentin (NEURONTIN) 300 MG capsule, Take 600 mg by mouth 2 (two) times daily. , Disp: , Rfl:    methocarbamol  (ROBAXIN ) 500 MG tablet, Take 1 tablet (500 mg total) by mouth every 6 (six) hours as needed for muscle spasms., Disp: 60 tablet, Rfl: 0   ondansetron  (ZOFRAN -ODT) 8 MG disintegrating tablet, Take 1 tablet (8 mg total) by mouth every 8 (eight) hours as needed for nausea or vomiting.,  Disp: 5 tablet, Rfl: 0   promethazine -dextromethorphan (PROMETHAZINE -DM) 6.25-15 MG/5ML syrup, Take 5 mLs by mouth at bedtime as needed., Disp: 118 mL, Rfl: 0  Observations/Objective: Patient is well-developed, well-nourished in no acute distress.  Resting comfortably at home.  Head is normocephalic, atraumatic.  No labored breathing.  Speech is clear and coherent with logical content.  Patient is alert and oriented at baseline.    Assessment and Plan: 1. Mild intermittent reactive airway disease without complication (Primary) - predniSONE  (DELTASONE ) 20 MG tablet; Take 2 tablets (40 mg total) by mouth daily with breakfast for 5 days.  Dispense: 10 tablet; Refill: 0   Follow Up Instructions: I discussed the assessment and treatment plan with the patient. The patient was provided an opportunity to ask questions and all were answered. The patient agreed with the plan and demonstrated an understanding of the instructions.  A copy of instructions were sent to the patient via MyChart unless otherwise noted below.    The patient was advised to call back or seek an in-person evaluation if the symptoms worsen or if the condition fails to improve as anticipated.    Candie Gintz W Channin Agustin, NP

## 2023-06-07 NOTE — Patient Instructions (Signed)
  Harvin Lines, thank you for joining Collins Dean, NP for today's virtual visit.  While this provider is not your primary care provider (PCP), if your PCP is located in our provider database this encounter information will be shared with them immediately following your visit.   A [facility] MyChart account gives you access to today's visit and all your visits, tests, and labs performed at [facility] " click here if you don't have a [facility] MyChart account or go to [email or web address]  Consent: (Patient) Trevor Zimmerman provided verbal consent for this virtual visit at the beginning of the encounter.  Current Medications:  Current Outpatient Medications:    predniSONE  (DELTASONE ) 20 MG tablet, Take 2 tablets (40 mg total) by mouth daily with breakfast for 5 days., Disp: 10 tablet, Rfl: 0   benzonatate  (TESSALON ) 100 MG capsule, Take 1 capsule (100 mg total) by mouth every 8 (eight) hours., Disp: 21 capsule, Rfl: 0   gabapentin (NEURONTIN) 300 MG capsule, Take 600 mg by mouth 2 (two) times daily. , Disp: , Rfl:    methocarbamol  (ROBAXIN ) 500 MG tablet, Take 1 tablet (500 mg total) by mouth every 6 (six) hours as needed for muscle spasms., Disp: 60 tablet, Rfl: 0   ondansetron  (ZOFRAN -ODT) 8 MG disintegrating tablet, Take 1 tablet (8 mg total) by mouth every 8 (eight) hours as needed for nausea or vomiting., Disp: 5 tablet, Rfl: 0   promethazine -dextromethorphan (PROMETHAZINE -DM) 6.25-15 MG/5ML syrup, Take 5 mLs by mouth at bedtime as needed., Disp: 118 mL, Rfl: 0   Medications ordered in this encounter:  Meds ordered this encounter  Medications   predniSONE  (DELTASONE ) 20 MG tablet    Sig: Take 2 tablets (40 mg total) by mouth daily with breakfast for 5 days.    Dispense:  10 tablet    Refill:  0    Supervising Provider:   Corine Dice [[id number]]     *If you need refills on other medications prior to your next appointment, please contact your  pharmacy*  Follow-Up: Call back or seek an in-person evaluation if the symptoms worsen or if the condition fails to improve as anticipated.  [facility] [phone]   If you have been instructed to have an in-person evaluation today at a local Urgent Care facility, please use the link below. It will take you to a list of all of our available [facility], including address, phone number and hours of operation. Please do not delay care.  [facility]  If you or a family member do not have a primary care provider, use the link below to schedule a visit and establish care. When you choose a [facility] primary care physician or advanced practice provider, you gain a long-term partner in health. Find a Primary Care Provider  Learn more about [facility] in-office and virtual care options: [facility] - Get Care Now

## 2023-12-03 IMAGING — CT CT RENAL STONE PROTOCOL
2 of 4 series · 16 of 46 positions shown, 18 images · non-contrast
Comparison: CT abdomen and pelvis 01/16/2016

CLINICAL DATA: Left flank pain



[Series 2: ap without · axial · non-contrast · 0.60mm/px · z∈[-1080,-655]mm · 13 of 95 slices shown, 15 images]
[im 5/95  soft-tissue]
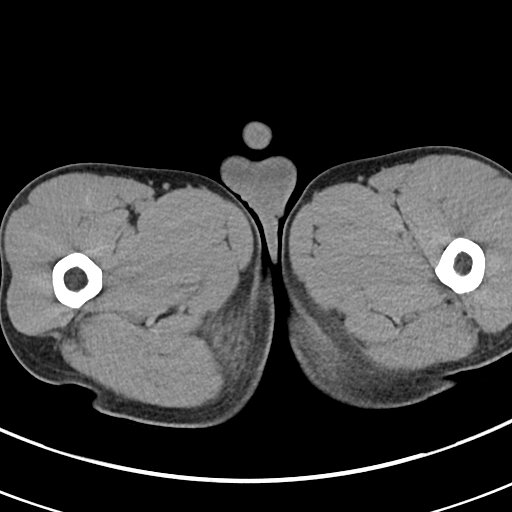
[im 5/95  bone]
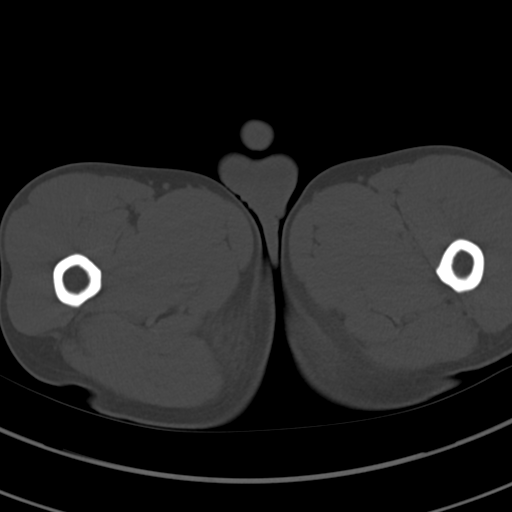
[im 15/95  soft-tissue]
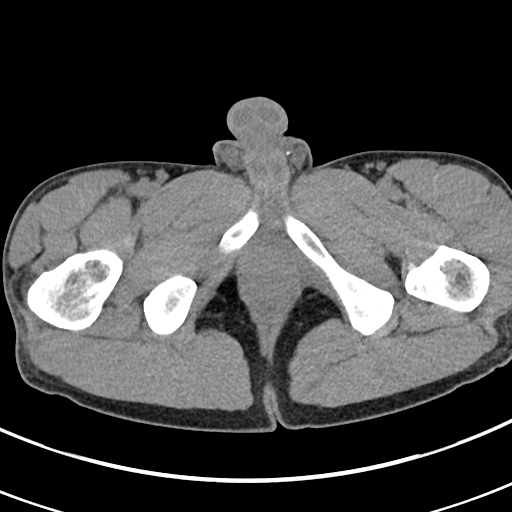
[im 19/95  soft-tissue]
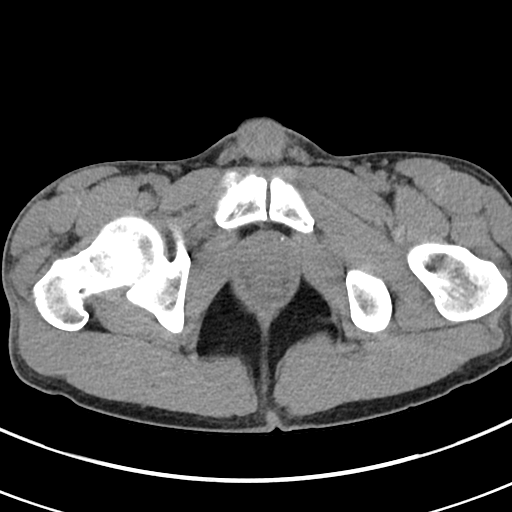
[im 29/95  soft-tissue]
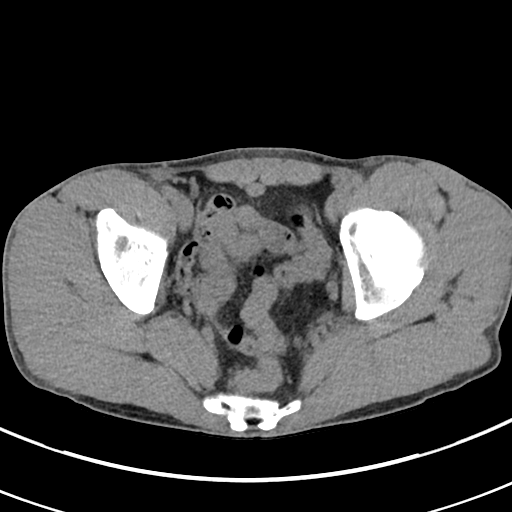
[im 33/95  soft-tissue]
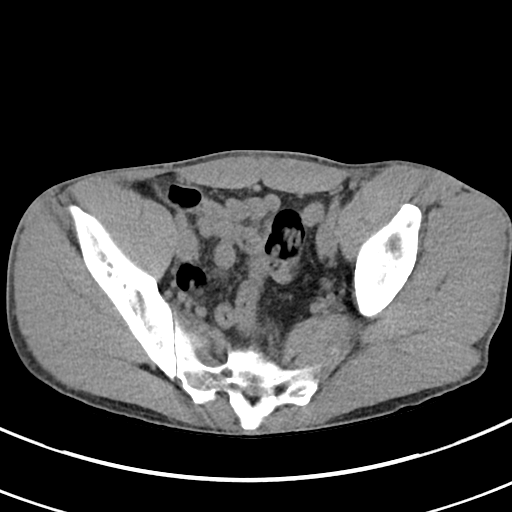
[im 43/95  soft-tissue]
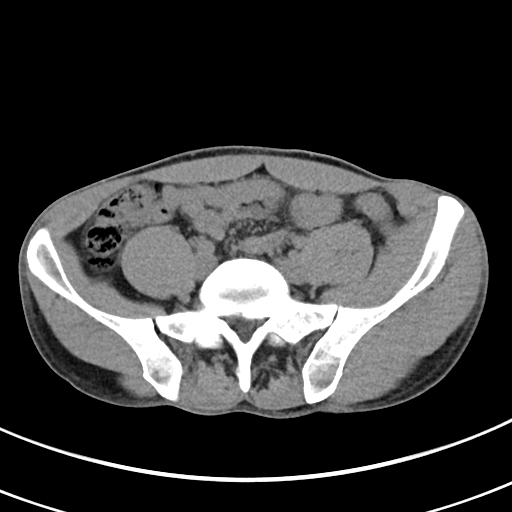
[im 48/95  soft-tissue]
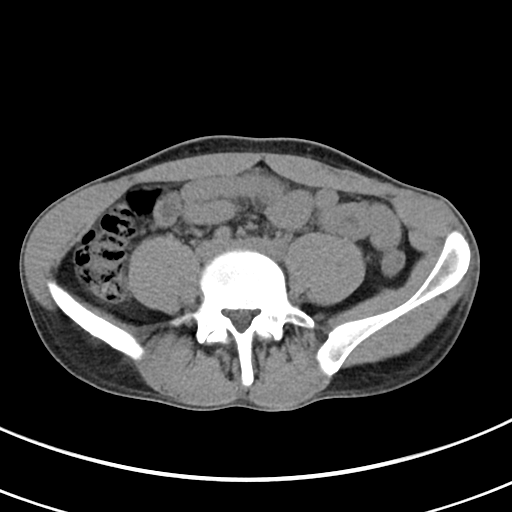
[im 52/95  soft-tissue]
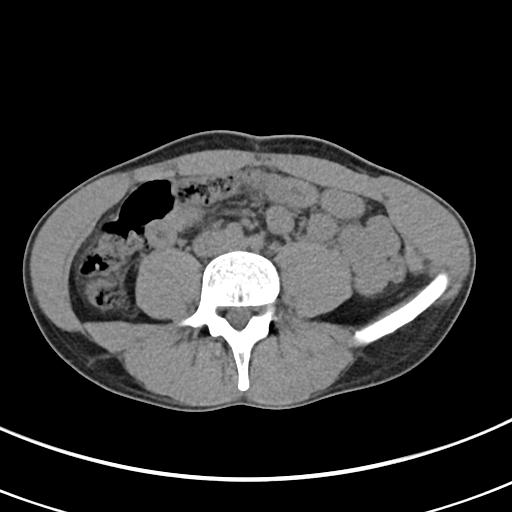
[im 62/95  soft-tissue]
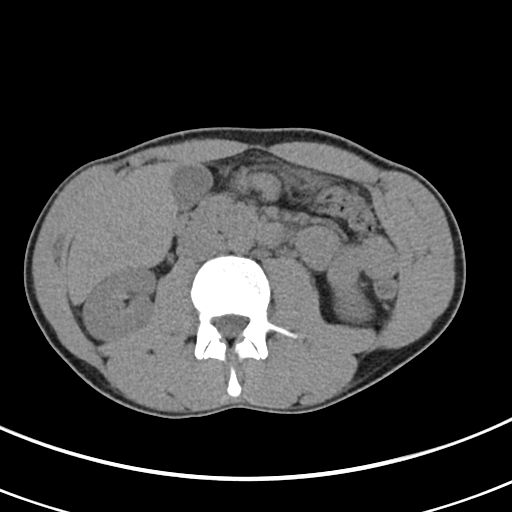
[im 62/95  bone]
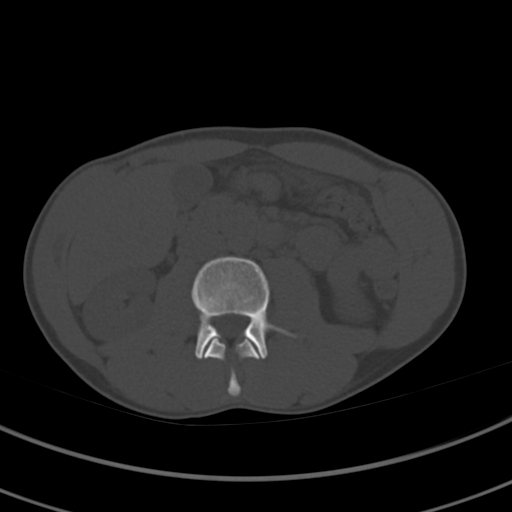
[im 66/95  soft-tissue]
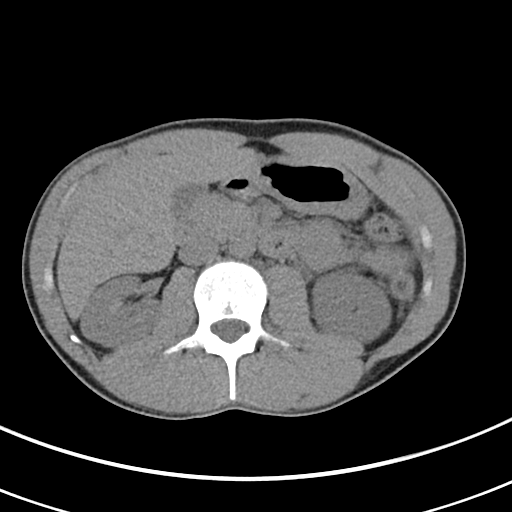
[im 76/95  soft-tissue]
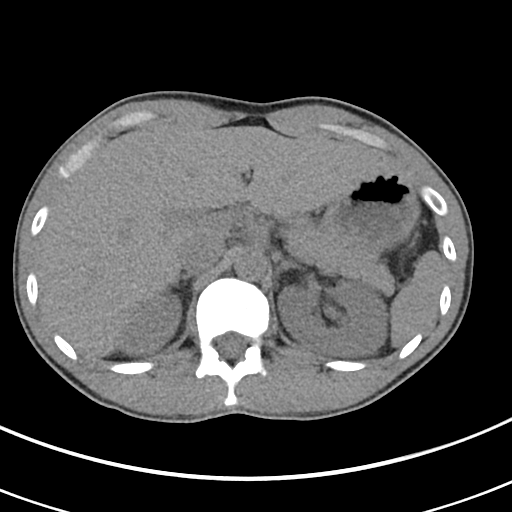
[im 80/95  soft-tissue]
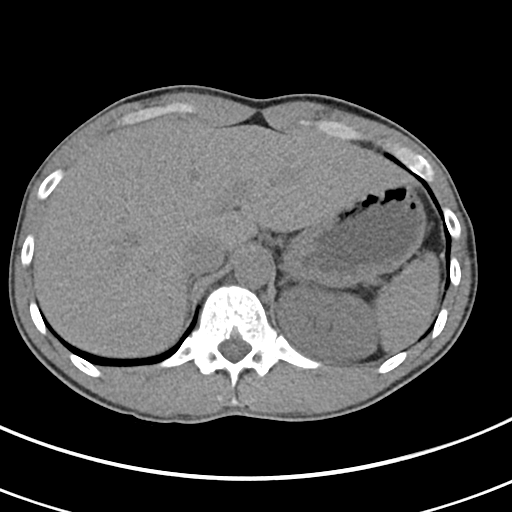
[im 90/95  soft-tissue]
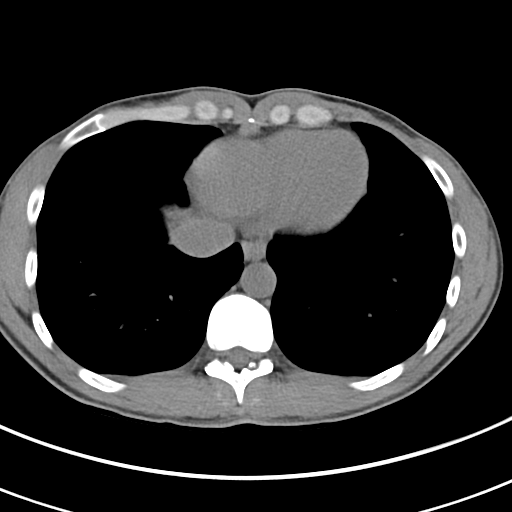

[Series 5: cor · coronal · 0.66mm/px · 3 of 70 slices shown]
[im 24/70  soft-tissue]
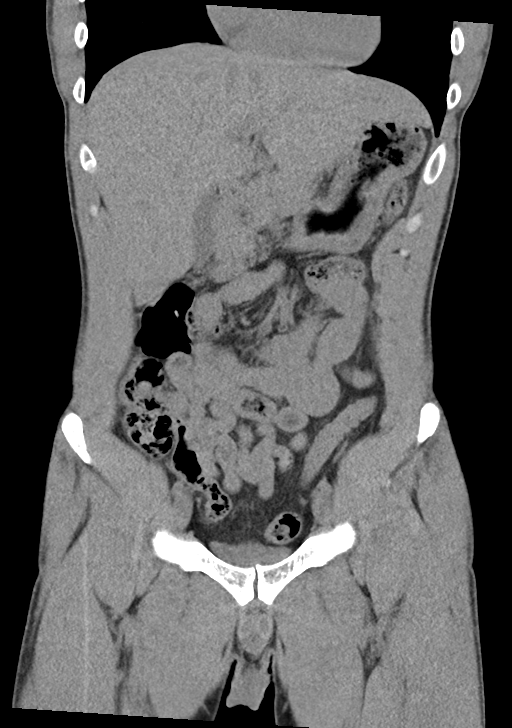
[im 31/70  soft-tissue]
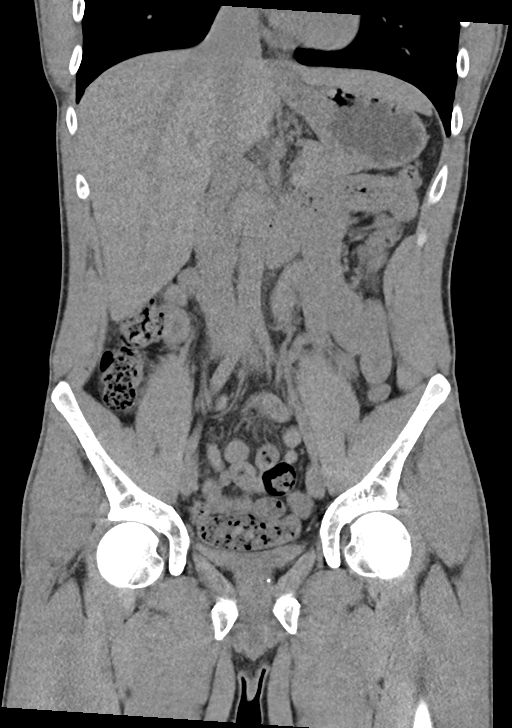
[im 39/70  soft-tissue]
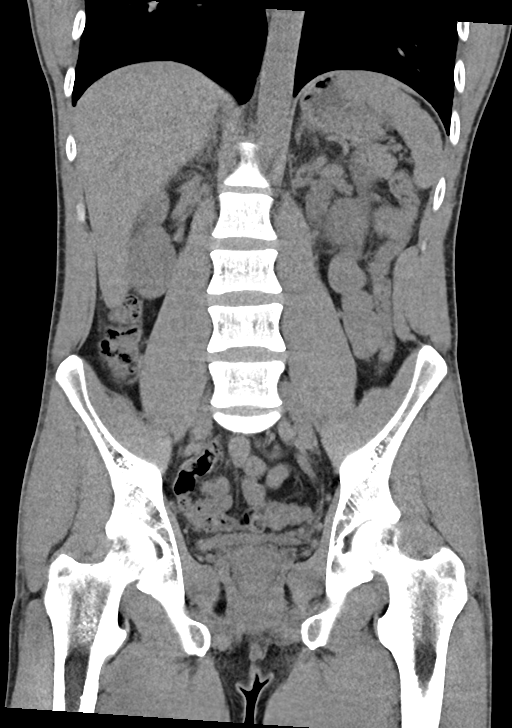

[16 of 46 positions shown; findings below may reference images not displayed]

FINDINGS: Lower chest: No acute abnormality.

Hepatobiliary: No focal liver abnormality is seen. No gallstones,
gallbladder wall thickening, or biliary dilatation.

Pancreas: Unremarkable. No pancreatic ductal dilatation or
surrounding inflammatory changes.

Spleen: Normal in size without focal abnormality.

Adrenals/Urinary Tract: Adrenal glands appear normal. Right kidney
appears normal with no nephrolithiasis or hydronephrosis. Mild left
hydroureteronephrosis. No nephrolithiasis on the left. There is a 2
mm calculus in the posterior left urinary bladder near the ureteral
orifice.

Stomach/Bowel: No bowel obstruction, free air or pneumatosis. No
bowel wall edema identified.

Vascular/Lymphatic: No significant vascular findings are present. No
enlarged abdominal or pelvic lymph nodes.

Reproductive: Prostate is unremarkable.

Other: No abdominal wall hernia or abnormality. No abdominopelvic
ascites.

Musculoskeletal: No acute or significant osseous findings.
IMPRESSION: 2 mm calculus at the posterior left urinary bladder near the
ureteral orifice with mild left hydroureteronephrosis.
